# Patient Record
Sex: Male | Born: 1940 | Race: White | Hispanic: No | Marital: Married | Smoking: Former smoker
Health system: Southern US, Community
[De-identification: ages and names within clinical notes are randomized; demographics above are authoritative.]

## PROBLEM LIST (undated history)

## (undated) DIAGNOSIS — K219 Gastro-esophageal reflux disease without esophagitis: Secondary | ICD-10-CM

## (undated) DIAGNOSIS — I2510 Atherosclerotic heart disease of native coronary artery without angina pectoris: Secondary | ICD-10-CM

## (undated) DIAGNOSIS — I714 Abdominal aortic aneurysm, without rupture, unspecified: Secondary | ICD-10-CM

## (undated) DIAGNOSIS — I739 Peripheral vascular disease, unspecified: Secondary | ICD-10-CM

## (undated) DIAGNOSIS — M810 Age-related osteoporosis without current pathological fracture: Secondary | ICD-10-CM

## (undated) DIAGNOSIS — I639 Cerebral infarction, unspecified: Secondary | ICD-10-CM

## (undated) DIAGNOSIS — E785 Hyperlipidemia, unspecified: Secondary | ICD-10-CM

## (undated) DIAGNOSIS — N189 Chronic kidney disease, unspecified: Secondary | ICD-10-CM

## (undated) DIAGNOSIS — C801 Malignant (primary) neoplasm, unspecified: Secondary | ICD-10-CM

## (undated) DIAGNOSIS — F0390 Unspecified dementia without behavioral disturbance: Secondary | ICD-10-CM

## (undated) DIAGNOSIS — I6529 Occlusion and stenosis of unspecified carotid artery: Secondary | ICD-10-CM

## (undated) DIAGNOSIS — Z9889 Other specified postprocedural states: Secondary | ICD-10-CM

## (undated) DIAGNOSIS — I10 Essential (primary) hypertension: Secondary | ICD-10-CM

## (undated) HISTORY — DX: Hyperlipidemia, unspecified: E78.5

## (undated) HISTORY — DX: Occlusion and stenosis of unspecified carotid artery: I65.29

## (undated) HISTORY — DX: Abdominal aortic aneurysm, without rupture, unspecified: I71.40

## (undated) HISTORY — DX: Abdominal aortic aneurysm, without rupture: I71.4

## (undated) HISTORY — PX: PROSTATE SURGERY: SHX751

## (undated) HISTORY — DX: Gastro-esophageal reflux disease without esophagitis: K21.9

## (undated) HISTORY — DX: Peripheral vascular disease, unspecified: I73.9

## (undated) HISTORY — PX: MASTOIDECTOMY: SHX711

## (undated) HISTORY — DX: Other specified postprocedural states: Z98.890

---

## 2007-10-29 HISTORY — PX: CORONARY ARTERY BYPASS GRAFT: SHX141

## 2008-06-23 ENCOUNTER — Ambulatory Visit: Payer: Self-pay | Admitting: Thoracic Surgery (Cardiothoracic Vascular Surgery)

## 2008-08-10 ENCOUNTER — Ambulatory Visit: Payer: Self-pay | Admitting: Cardiothoracic Surgery

## 2009-03-17 ENCOUNTER — Ambulatory Visit: Payer: Self-pay | Admitting: Vascular Surgery

## 2010-03-09 ENCOUNTER — Ambulatory Visit: Payer: Self-pay | Admitting: Vascular Surgery

## 2011-03-12 ENCOUNTER — Encounter (INDEPENDENT_AMBULATORY_CARE_PROVIDER_SITE_OTHER): Payer: Medicare Other

## 2011-03-12 DIAGNOSIS — I714 Abdominal aortic aneurysm, without rupture: Secondary | ICD-10-CM

## 2011-03-12 NOTE — Assessment & Plan Note (Signed)
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  He denies any new medical  difficulties.  He has not had any  GI bleeding.  He denies any symptoms  referable to his aneurysm.  He remains stable from a cardiac standpoint,  having undergone coronary artery bypass grafting in the past.  He does  have elevated cholesterol.   Family history is positive for premature atherosclerotic disease in his  mother.   SOCIAL HISTORY:  He is married with 3 children.  He is retired.  He does  not smoke, having quit in August 2009.  He drinks 1 beer a day.   REVIEW OF SYSTEMS:  No weight loss or weight gain.  He weighs 137  pounds.  He is 5 feet 4 inches tall.  CARDIAC:  Negative.  PULMONARY:  Negative.  GI:  Positive for reflux.  GU:  Positive for urinary frequency.  VASCULAR:  Negative.  NEUROLOGIC:  He does have occasional dizziness.  MUSCULOSKELETAL:  Negative.  PSYCHIATRIC:  Negative.  HEENT:  He has had a diminished change in his hearing.  HEMATOLOGIC:  Negative.  SKIN:  Negative.   PHYSICAL EXAMINATION:  A well-developed, thin white male appearing his  stated age.  Blood pressure is 152/81, pulse 52, respirations 18.  He is  in no acute stress.  HEENT is normal.  He has a harsh left carotid  bruit.  No bruit on the right.  Chest is clear bilaterally.  Heart:  Regular rate and rhythm.  He has 2+ radial, 2+ femoral, and 2+ posterior  tibial pulses.  Abdomen is soft.  He does have a prominent aortic  pulsation.  He has no masses and no tenderness.  Musculoskeletal shows  no major deformity or cyanosis.  Neurologic:  No focal within or  paresthesias.  Skin without ulcers or rashes.   He did undergo repeat  ultrasound of his aorta today, and this shows a  slight increase in size from a maximum of 4 cm up to 4.3 cm from 1 year  ago.  Due to his harsh right carotid bruit, he underwent carotid duplex  in our office today.  This shows mild to moderate carotid stenosis in  the right internal carotid.  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  I explained this to the patient with his family  present.  There is no issue regarding his external carotid.  He  understands this, and we will continue his usual activities.  We plan to  see him again in 1 year with repeat ultrasound of his aorta to rule out  any progression in size.  I did explain symptoms of carotid disease with  him.  He knows to report immediately should this occur.     Larina Earthly, M.D.  Electronically Signed   TFE/MEDQ  D:  03/09/2010  T:  03/12/2010  Job:  [id number]   cc:   Dr. Karis Juba, [facility]

## 2011-03-12 NOTE — Consult Note (Signed)
NEW PATIENT CONSULTATION   Juan Montoya, Juan Montoya  DOB:  April 25, 1941                                       03/17/2009  CHART#:[id number]   The patient presents today for a second opinion regarding peripheral  vascular occlusive disease.Marland Kitchen  His daughter is here with him today.  She  works for Dr. Cephas Darby and I have been asked to see him for  evaluation regarding his peripheral vascular issues.  He is a very  pleasant 70 year old gentleman who had been in reasonable health.  He  underwent  coronary bypass grafting in September 2009.  In October, he  had  a GI bleed with studies showing evidence of left colon ischemic  colitis.  He had another bleed that did not require hospitalization in  November and then in March 2010 had another GI bleed causing repeat  admission, again with a diagnosis of ischemic colitis.  He reports that  he had a 10 pound weight loss at the time of his coronary bypass  grafting but his weight is been stable since that time.  He denies any  pain associated with any of these issues with bleeding.  He specifically  denies any food fear.  He has had an extensive appropriate workup by Dr.  Lollie Sails in [facility].  I have these records and have reviewed his  multiple office visits and evaluations.  Most specifically he has had  mesenteric noninvasive studies which was a  good quality study which  showed no evidence of significant occlusive disease with a patent  celiac, superior mesenteric artery and inferior mesenteric artery.  He  has a known infrarenal abdominal aortic aneurysm which is 3.5 to 4 cm in  maximal diameter.  He has no evidence of peripheral occlusive disease.  He also has known moderate carotid disease on the right from an  ultrasound duplex in October 2009.  He denies any prior stroke.  He has  been stable from a cardiac standpoint since his coronary bypass.  Also  of note he does have baseline renal insufficiency with a  creatinine of  2.5 to 3 range.  The other medical difficulty includes COPD,  hyperlipidemia, diabetes.  He is not insulin-dependent.  His medication  list is attached.   PHYSICAL EXAMINATION:  He is a thin white male in no acute distress.  He  is grossly intact neurologically.  He does require hearing aids.  He has  a harsh right carotid bruit.  No bruit on the left.  His radial pulses  are 2+. He has 2+ femoral, 2+ popliteal and 2+ posterior tibial pulses  bilaterally.  He has prominent pulses but no evidence of peripheral  aneurysm.  His abdominal exam, he is thin.  He has an easily palpable  aneurysm which is nontender.   I discussed his vascular studies from Dr. Mayford Knife' office with the  patient and his daughter.  I completely agree with Dr. Mayford Knife  assessment.  I do not see any evidence of any correctable mesenteric  ischemia as the cause for his GI bleeding.  He does not have any  evidence of intestinal angina and does have noninvasive studies showing  a widely patent mesenteric vessels.  I also agree that he is at a  prohibitive risk for renal worsening with contrast dye and therefore  would not recommend this.  They were reassured with this discussion and  will see [place] again on an as-needed basis.   Larina Earthly, M.D.  Electronically Signed   TFE/MEDQ  D:  03/17/2009  Montoya:  03/20/2009  Job:  [id number]   cc:   Lollie Sails

## 2011-03-12 NOTE — Procedures (Signed)
CAROTID DUPLEX EXAM   INDICATION:  Carotid bruit with known carotid artery disease.   HISTORY:  Diabetes:  No.  Cardiac:  CABG.  Hypertension:  Yes.  Smoking:  Previous.  Previous Surgery:  No carotid artery disease.  CV History:  Asymptomatic.  Amaurosis Fugax No, Paresthesias No, Hemiparesis No                                       RIGHT             LEFT  Brachial systolic pressure:         172               170  Brachial Doppler waveforms:         WNL               WNL  Vertebral direction of flow:        Antegrade         Antegrade  DUPLEX VELOCITIES (cm/sec)  CCA peak systolic                   66                71  ECA peak systolic                   410               113  ICA peak systolic                   135               83  ICA end diastolic                   42                23  PLAQUE MORPHOLOGY:                  Calcific          Calcific  PLAQUE AMOUNT:                      Mild / moderate   Mild  PLAQUE LOCATION:                    ICA/ECA/CCA       ICA/ECA/CCA   IMPRESSION:  1. Right internal carotid artery shows evidence of 40%-59% stenosis.  2. Left internal carotid artery shows evidence of 1%-39% stenosis.  3. No significant changes from previous study done 07/29/2008 at      [facility].   ___________________________________________  Larina Earthly, M.D.   AS/MEDQ  D:  03/09/2010  T:  03/09/2010  Job:  [id number]

## 2011-03-12 NOTE — Procedures (Signed)
DUPLEX ULTRASOUND OF ABDOMINAL AORTA   INDICATION:  Followup known abdominal aortic aneurysm.   HISTORY:  Diabetes:  No.  Cardiac:  CABG times 4.  Hypertension:  Yes.  Smoking:  Quit 1 year ago.  Connective Tissue Disorder:  Family History:  No.  Previous Surgery:  No.   DUPLEX EXAM:         AP (cm)                   TRANSVERSE (cm)  Proximal             3.12 cm                   2.41 cm  Mid                  4.08 cm                   4.35 cm  Distal               3.23 cm                   3.76 cm  Right Iliac          1.13 cm                   1.31 cm  Left Iliac           0.81 cm                   0.82 cm   PREVIOUS:  Date:  AP:  3.5  TRANSVERSE:  4.0   IMPRESSION:  There appears to be an abdominal aortic aneurysm noted with  largest measurement of 4.08 cm x 4.35 cm.   ___________________________________________  Larina Earthly, M.D.   CB/MEDQ  D:  03/09/2010  T:  03/09/2010  Job:  [id number]

## 2011-03-14 NOTE — Procedures (Unsigned)
DUPLEX ULTRASOUND OF ABDOMINAL AORTA  INDICATION:  Known AAA.  HISTORY: Diabetes:  No. Cardiac:  CABG x4. Hypertension:  Yes. Smoking:  Quit, 2010. Connective Tissue Disorder: Family History:  No. Previous Surgery:  No.  DUPLEX EXAM:         AP (cm)                   TRANSVERSE (cm) Proximal             2.88 cm                   2.83 cm Mid                  3.54 cm                   3.57 cm Distal               3.49 cm                   3.63 cm Right Iliac          Not visualized            Not visualized Left Iliac           Not visualized            Not visualized  PREVIOUS:  Date: 03/09/2010  AP:  4.08  TRANSVERSE:  4.35  IMPRESSION: 1. Abdominal aortic aneurysm noted with largest measurement of 3.54 X     3.57 cm. 2. Somewhat limited visualization due to overlying bowel gas.  ___________________________________________ Larina Earthly, M.D.  EM/MEDQ  D:  03/12/2011  T:  03/12/2011  Job:  [id number]

## 2012-01-12 DIAGNOSIS — Z9889 Other specified postprocedural states: Secondary | ICD-10-CM

## 2012-01-12 HISTORY — DX: Other specified postprocedural states: Z98.890

## 2012-03-10 ENCOUNTER — Encounter: Payer: Self-pay | Admitting: Vascular Surgery

## 2012-03-16 ENCOUNTER — Encounter: Payer: Self-pay | Admitting: Neurosurgery

## 2012-03-17 ENCOUNTER — Encounter: Payer: Self-pay | Admitting: Neurosurgery

## 2012-03-17 ENCOUNTER — Ambulatory Visit (INDEPENDENT_AMBULATORY_CARE_PROVIDER_SITE_OTHER): Payer: Medicare Other | Admitting: Neurosurgery

## 2012-03-17 ENCOUNTER — Ambulatory Visit (INDEPENDENT_AMBULATORY_CARE_PROVIDER_SITE_OTHER): Payer: Medicare Other | Admitting: *Deleted

## 2012-03-17 VITALS — BP 157/74 | HR 51 | Resp 16 | Ht 65.5 in | Wt 148.8 lb

## 2012-03-17 DIAGNOSIS — I714 Abdominal aortic aneurysm, without rupture, unspecified: Secondary | ICD-10-CM

## 2012-03-17 NOTE — Progress Notes (Signed)
[facility] HISTORY AND PHYSICAL   CC: AAA duplex for known aneurysm Referring Physician: Early  History of Present Illness: 71 year old male patient of Dr. early is followed for known AAA. Patient nor his wife report any of unusual abdominal or back pain. Patient reports no signs or symptoms of amaurosis fugax. Dr. Arbie Cookey also investigated course carotid bruits with duplex last year but these were not repeated this year. Patient reports no other new medical problems or any recent surgeries.  Past Medical History  Diagnosis Date  . Hyperlipidemia   . GERD (gastroesophageal reflux disease)   . AAA (abdominal aortic aneurysm)   . Peripheral vascular disease   . Carotid artery occlusion   . S/P colonoscopy 01/12/12    ROS: [x]  Positive   [ ]  Denies    General: [ ]  Weight loss, [ ]  Fever, [ ]  chills Neurologic: [ ]  Dizziness, [ ]  Blackouts, [ ]  Seizure [ ]  Stroke, [ ]  "Mini stroke", [ ]  Slurred speech, [ ]  Temporary blindness; [ ]  weakness in arms or legs, [ ]  Hoarseness Cardiac: [ ]  Chest pain/pressure, [ ]  Shortness of breath at rest [ ]  Shortness of breath with exertion, [ ]  Atrial fibrillation or irregular heartbeat Vascular: [ ]  Pain in legs with walking, [ ]  Pain in legs at rest, [ ]  Pain in legs at night,  [ ]  Non-healing ulcer, [ ]  Blood clot in vein/DVT,   Pulmonary: [ ]  Home oxygen, [ ]  Productive cough, [ ]  Coughing up blood, [ ]  Asthma,  [ ]  Wheezing Musculoskeletal:  [ ]  Arthritis, [ ]  Low back pain, [ ]  Joint pain Hematologic: [ ]  Easy Bruising, [ ]  Anemia; [ ]  Hepatitis Gastrointestinal: [ ]  Blood in stool, [ ]  Gastroesophageal Reflux/heartburn, [ ]  Trouble swallowing Urinary: [ ]  chronic Kidney disease, [ ]  on HD - [ ]  MWF or [ ]  TTHS, [ ]  Burning with urination, [ ]  Difficulty urinating Skin: [ ]  Rashes, [ ]  Wounds Psychological: [ ]  Anxiety, [ ]  Depression   Social History History  Substance Use Topics  . Smoking status: Former Smoker      Quit date: 06/27/2008  . Smokeless tobacco: Not on file  . Alcohol Use: Not on file    Family History Family History  Problem Relation Age of Onset  . Coronary artery disease Mother     Allergies  Allergen Reactions  . Ace Inhibitors   . Hctz (Hydrochlorothiazide)     Current Outpatient Prescriptions  Medication Sig Dispense Refill  . amLODipine (NORVASC) 5 MG tablet Take 5 mg by mouth daily.      Marland Kitchen aspirin EC 81 MG tablet Take 81 mg by mouth every other day.      . cloNIDine (CATAPRES - DOSED IN MG/24 HR) 0.1 mg/24hr patch Place 1 patch onto the skin once a week.      . donepezil (ARICEPT) 10 MG tablet Take 10 mg by mouth every morning.       . indomethacin (INDOCIN) 50 MG capsule Take 50 mg by mouth as needed.      . metoprolol succinate (TOPROL-XL) 25 MG 24 hr tablet Take 12.5 mg by mouth 2 (two) times daily.       Marland Kitchen omeprazole (PRILOSEC) 20 MG capsule Take 20 mg by mouth daily.      . pravastatin (PRAVACHOL) 80 MG tablet Take 80 mg by mouth at bedtime.      . sodium bicarbonate 650 MG tablet Take  650 mg by mouth 2 (two) times daily.      . solifenacin (VESICARE) 10 MG tablet Take 5 mg by mouth at bedtime.      . Tamsulosin HCl (FLOMAX) 0.4 MG CAPS Take 0.4 mg by mouth daily.      . ranolazine (RANEXA) 500 MG 12 hr tablet Take 500 mg by mouth 2 (two) times daily.      . simvastatin (ZOCOR) 20 MG tablet Take 20 mg by mouth every evening.        Physical Examination  Filed Vitals:   03/17/12 0934  BP: 157/74  Pulse: 51  Resp: 16    Body mass index is 24.38 kg/(m^2).  General:  WDWN in NAD Gait: Normal HEENT: WNL Eyes: Pupils equal Pulmonary: normal non-labored breathing , without Rales, rhonchi,  wheezing Cardiac: RRR, without  Murmurs, rubs or gallops; No carotid bruits Abdomen: soft, NT, no masses Skin: no rashes, ulcers noted Vascular Exam/Pulses: Patient has 2+ radial pulses bilaterally, carotid bruits are heard bilaterally, there is a palpable abdominal  mass, PT and DP pulses are 1+ bilaterally  Extremities without ischemic changes, no Gangrene , no cellulitis; no open wounds;  Musculoskeletal: no muscle wasting or atrophy  Neurologic: A&O X 3; Appropriate Affect ; SENSATION: normal; MOTOR FUNCTION:  moving all extremities equally. Speech is fluent/normal  Non-Invasive Vascular Imaging: AAA duplex shows a mid AP and transverse 3.7 which is stable from last year  ASSESSMENT/PLAN: Patient with stable AAA that is asymptomatic, the patient will return in one year with AAA duplex as well as carotid duplex and be seen in my clinic. He and his wife's questions were encouraged and answered they're in agreement with this plan.  Lauree Chandler ANP  Clinic M.D.: Early

## 2012-03-18 NOTE — Progress Notes (Signed)
Addended by: Sharee Pimple on: 03/18/2012 10:03 AM   Modules accepted: Orders

## 2012-03-25 NOTE — Procedures (Unsigned)
DUPLEX ULTRASOUND OF ABDOMINAL AORTA  INDICATION:  Follow up AAA.  HISTORY: Diabetes:  No. Cardiac:  Yes. Hypertension:  Yes. Smoking:  Previous. Connective Tissue Disorder: Family History:  No. Previous Surgery:  No.  DUPLEX EXAM:         AP (cm)                   TRANSVERSE (cm) Proximal             2.81 cm                   2.81 cm Mid                  3.7 cm                    3.7 cm Distal               NV Right Iliac          0.78 cm; 270 cm/s         0.9 cm Left Iliac           1.6 cm; 303 cm/s          1.7 cm  PREVIOUS:  Date: 03/12/2011  AP:  3.5  TRANSVERSE:  3.6   IMPRESSION: 1. Stable abdominal aortic aneurysm measuring approximately 3.7 cm x     3.7 cm on today's exam with mild intramural thrombus observed. 2. The left common iliac artery appears ectatic with disease present.     Velocities and waveforms are abnormal. 3. The right common iliac artery appears to be within normal limits.          ___________________________________________ Larina Earthly, M.D.  LT/MEDQ  D:  03/17/2012  T:  03/17/2012  Job:  [id number]

## 2013-03-16 ENCOUNTER — Other Ambulatory Visit (INDEPENDENT_AMBULATORY_CARE_PROVIDER_SITE_OTHER): Payer: 59 | Admitting: *Deleted

## 2013-03-16 ENCOUNTER — Ambulatory Visit: Payer: Medicare Other | Admitting: Neurosurgery

## 2013-03-16 DIAGNOSIS — I714 Abdominal aortic aneurysm, without rupture: Secondary | ICD-10-CM

## 2013-03-16 DIAGNOSIS — I6529 Occlusion and stenosis of unspecified carotid artery: Secondary | ICD-10-CM

## 2013-03-18 ENCOUNTER — Other Ambulatory Visit: Payer: Self-pay | Admitting: *Deleted

## 2013-03-18 ENCOUNTER — Encounter: Payer: Self-pay | Admitting: Vascular Surgery

## 2013-03-18 DIAGNOSIS — I714 Abdominal aortic aneurysm, without rupture: Secondary | ICD-10-CM

## 2013-12-14 ENCOUNTER — Other Ambulatory Visit: Payer: Self-pay | Admitting: Vascular Surgery

## 2013-12-14 DIAGNOSIS — R0989 Other specified symptoms and signs involving the circulatory and respiratory systems: Secondary | ICD-10-CM

## 2014-03-18 ENCOUNTER — Encounter: Payer: Self-pay | Admitting: Vascular Surgery

## 2014-03-22 ENCOUNTER — Ambulatory Visit (HOSPITAL_COMMUNITY)
Admission: RE | Admit: 2014-03-22 | Discharge: 2014-03-22 | Disposition: A | Discharge status: Home / Self Care | Payer: Medicare Other | Source: Ambulatory Visit | Attending: Vascular Surgery | Admitting: Vascular Surgery

## 2014-03-22 ENCOUNTER — Ambulatory Visit (INDEPENDENT_AMBULATORY_CARE_PROVIDER_SITE_OTHER): Payer: Medicare Other | Admitting: Vascular Surgery

## 2014-03-22 ENCOUNTER — Encounter: Payer: Self-pay | Admitting: Vascular Surgery

## 2014-03-22 ENCOUNTER — Ambulatory Visit (INDEPENDENT_AMBULATORY_CARE_PROVIDER_SITE_OTHER)
Admission: RE | Admit: 2014-03-22 | Discharge: 2014-03-22 | Disposition: A | Discharge status: Home / Self Care | Payer: Medicare Other | Source: Ambulatory Visit | Attending: Vascular Surgery | Admitting: Vascular Surgery

## 2014-03-22 VITALS — BP 158/68 | HR 65 | Resp 16 | Ht 66.5 in | Wt 148.7 lb

## 2014-03-22 DIAGNOSIS — R0989 Other specified symptoms and signs involving the circulatory and respiratory systems: Secondary | ICD-10-CM | POA: Insufficient documentation

## 2014-03-22 DIAGNOSIS — I714 Abdominal aortic aneurysm, without rupture, unspecified: Secondary | ICD-10-CM

## 2014-03-22 DIAGNOSIS — I6529 Occlusion and stenosis of unspecified carotid artery: Secondary | ICD-10-CM

## 2014-03-22 NOTE — Progress Notes (Signed)
Today for followup of his infrarenal abdominal aortic aneurysm. He's also been followed for many years with a carotid duplex. This is always showed some right external carotid stenosis but no evidence of internal carotid stenosis. He reports that he has had no new major medical difficulties. He has had no transient ischemic attacks or stroke. He remained stable from a cardiac standpoint. He is active.  Past Medical History  Diagnosis Date  . Hyperlipidemia   . GERD (gastroesophageal reflux disease)   . AAA (abdominal aortic aneurysm)   . Peripheral vascular disease   . Carotid artery occlusion   . S/P colonoscopy 01/12/12    History  Substance Use Topics  . Smoking status: Former Smoker    Quit date: 06/27/2008  . Smokeless tobacco: Never Used  . Alcohol Use: 4.2 oz/week    7 Cans of beer per week    Family History  Problem Relation Age of Onset  . Coronary artery disease Mother     Allergies  Allergen Reactions  . Ace Inhibitors   . Hctz [Hydrochlorothiazide]     Current outpatient prescriptions:amLODipine (NORVASC) 5 MG tablet, Take 5 mg by mouth daily., Disp: , Rfl: ;  aspirin EC 81 MG tablet, Take 81 mg by mouth every other day., Disp: , Rfl: ;  atorvastatin (LIPITOR) 80 MG tablet, Take 80 mg by mouth daily., Disp: , Rfl: ;  cloNIDine (CATAPRES - DOSED IN MG/24 HR) 0.1 mg/24hr patch, Take 0.1 mg by mouth at bedtime. , Disp: , Rfl:  donepezil (ARICEPT) 10 MG tablet, Take 10 mg by mouth every morning. , Disp: , Rfl: ;  indomethacin (INDOCIN) 50 MG capsule, Take 50 mg by mouth as needed., Disp: , Rfl: ;  metoprolol succinate (TOPROL-XL) 25 MG 24 hr tablet, Take 12.5 mg by mouth 2 (two) times daily. , Disp: , Rfl: ;  omeprazole (PRILOSEC) 20 MG capsule, Take 20 mg by mouth daily., Disp: , Rfl: ;  pravastatin (PRAVACHOL) 80 MG tablet, Take 80 mg by mouth at bedtime., Disp: , Rfl:  sodium bicarbonate 650 MG tablet, Take 650 mg by mouth 2 (two) times daily., Disp: , Rfl: ;  solifenacin  (VESICARE) 10 MG tablet, Take 5 mg by mouth at bedtime., Disp: , Rfl: ;  Tamsulosin HCl (FLOMAX) 0.4 MG CAPS, Take 0.4 mg by mouth daily., Disp: , Rfl: ;  ranolazine (RANEXA) 500 MG 12 hr tablet, Take 500 mg by mouth 2 (two) times daily., Disp: , Rfl: ;  simvastatin (ZOCOR) 20 MG tablet, Take 20 mg by mouth every evening., Disp: , Rfl:   BP 158/68  Pulse 65  Resp 16  Ht 5' 6.5" (1.689 m)  Wt 148 lb 11.2 oz (67.45 kg)  BMI 23.64 kg/m2  Body mass index is 23.64 kg/(m^2).       Physical exam well-developed well-nourished in no acute distress Carotid arteries without bruits bilaterally Radial pulses 2+ bilaterally Heart regular rate and rhythm without murmur Radial pulses 2+ bilaterally Abdomen soft nontender prominent aortic pulsation with no tenderness over the aorta  Ultrasound today revealed no evidence of internal carotid artery stenosis. The patient does have known external carotid artery stenosis on the right  Abdominal ultrasound today shows maximal diameter 4 cm which is somewhat less than the study one year ago. He did have some overlying bowel gas  Impression and plan stable aortic aneurysm size with no evidence of progression. We'll repeat in one year. A carotid duplex shows no evidence of significant internal carotid artery  stenosis. Discuss this with the patient and his wife have recommended discontinuation of yearly carotid duplex

## 2014-03-22 NOTE — Addendum Note (Signed)
Addended by: Mena Goes on: 03/22/2014 10:55 AM   Modules accepted: Orders

## 2014-06-25 DIAGNOSIS — N183 Chronic kidney disease, stage 3 unspecified: Secondary | ICD-10-CM | POA: Diagnosis present

## 2014-06-25 DIAGNOSIS — H9190 Unspecified hearing loss, unspecified ear: Secondary | ICD-10-CM | POA: Insufficient documentation

## 2014-06-25 DIAGNOSIS — N529 Male erectile dysfunction, unspecified: Secondary | ICD-10-CM | POA: Insufficient documentation

## 2014-06-25 DIAGNOSIS — K559 Vascular disorder of intestine, unspecified: Secondary | ICD-10-CM | POA: Insufficient documentation

## 2014-06-25 DIAGNOSIS — K5790 Diverticulosis of intestine, part unspecified, without perforation or abscess without bleeding: Secondary | ICD-10-CM | POA: Insufficient documentation

## 2014-06-25 DIAGNOSIS — H90A32 Mixed conductive and sensorineural hearing loss, unilateral, left ear with restricted hearing on the contralateral side: Secondary | ICD-10-CM | POA: Insufficient documentation

## 2014-06-25 DIAGNOSIS — R351 Nocturia: Secondary | ICD-10-CM | POA: Insufficient documentation

## 2014-06-25 DIAGNOSIS — N401 Enlarged prostate with lower urinary tract symptoms: Secondary | ICD-10-CM

## 2014-06-25 DIAGNOSIS — K4090 Unilateral inguinal hernia, without obstruction or gangrene, not specified as recurrent: Secondary | ICD-10-CM | POA: Insufficient documentation

## 2014-06-25 DIAGNOSIS — I6529 Occlusion and stenosis of unspecified carotid artery: Secondary | ICD-10-CM | POA: Insufficient documentation

## 2014-06-25 DIAGNOSIS — I714 Abdominal aortic aneurysm, without rupture, unspecified: Secondary | ICD-10-CM | POA: Insufficient documentation

## 2014-06-25 DIAGNOSIS — E291 Testicular hypofunction: Secondary | ICD-10-CM | POA: Insufficient documentation

## 2014-06-25 DIAGNOSIS — I658 Occlusion and stenosis of other precerebral arteries: Secondary | ICD-10-CM | POA: Insufficient documentation

## 2014-06-25 DIAGNOSIS — J432 Centrilobular emphysema: Secondary | ICD-10-CM | POA: Insufficient documentation

## 2014-06-25 DIAGNOSIS — D126 Benign neoplasm of colon, unspecified: Secondary | ICD-10-CM | POA: Insufficient documentation

## 2014-06-25 DIAGNOSIS — H547 Unspecified visual loss: Secondary | ICD-10-CM | POA: Insufficient documentation

## 2014-06-25 DIAGNOSIS — J449 Chronic obstructive pulmonary disease, unspecified: Secondary | ICD-10-CM | POA: Diagnosis present

## 2014-06-25 DIAGNOSIS — I10 Essential (primary) hypertension: Secondary | ICD-10-CM | POA: Diagnosis present

## 2014-06-25 DIAGNOSIS — K5520 Angiodysplasia of colon without hemorrhage: Secondary | ICD-10-CM | POA: Insufficient documentation

## 2014-06-25 DIAGNOSIS — H95199 Other disorders following mastoidectomy, unspecified ear: Secondary | ICD-10-CM | POA: Insufficient documentation

## 2014-06-25 DIAGNOSIS — R3915 Urgency of urination: Secondary | ICD-10-CM | POA: Insufficient documentation

## 2014-06-25 DIAGNOSIS — N138 Other obstructive and reflux uropathy: Secondary | ICD-10-CM | POA: Diagnosis present

## 2014-11-02 DIAGNOSIS — I679 Cerebrovascular disease, unspecified: Secondary | ICD-10-CM | POA: Insufficient documentation

## 2014-12-27 DIAGNOSIS — H5440 Blindness, one eye, unspecified eye: Secondary | ICD-10-CM | POA: Diagnosis present

## 2014-12-27 DIAGNOSIS — Z0000 Encounter for general adult medical examination without abnormal findings: Secondary | ICD-10-CM | POA: Insufficient documentation

## 2015-03-21 ENCOUNTER — Other Ambulatory Visit: Payer: Self-pay | Admitting: Vascular Surgery

## 2015-03-21 DIAGNOSIS — I714 Abdominal aortic aneurysm, without rupture, unspecified: Secondary | ICD-10-CM

## 2015-03-24 ENCOUNTER — Encounter: Payer: Self-pay | Admitting: Family

## 2015-03-28 ENCOUNTER — Ambulatory Visit (INDEPENDENT_AMBULATORY_CARE_PROVIDER_SITE_OTHER): Payer: Medicare Other | Admitting: Family

## 2015-03-28 ENCOUNTER — Ambulatory Visit (HOSPITAL_COMMUNITY)
Admission: RE | Admit: 2015-03-28 | Discharge: 2015-03-28 | Disposition: A | Discharge status: Home / Self Care | Payer: Medicare Other | Source: Ambulatory Visit | Attending: Vascular Surgery | Admitting: Vascular Surgery

## 2015-03-28 ENCOUNTER — Encounter: Payer: Self-pay | Admitting: Family

## 2015-03-28 VITALS — BP 136/70 | HR 44 | Temp 96.8°F | Resp 16 | Ht 64.5 in | Wt 153.0 lb

## 2015-03-28 DIAGNOSIS — I714 Abdominal aortic aneurysm, without rupture, unspecified: Secondary | ICD-10-CM

## 2015-03-28 DIAGNOSIS — Z87891 Personal history of nicotine dependence: Secondary | ICD-10-CM

## 2015-03-28 NOTE — Progress Notes (Signed)
[facility] [place]  Established Abdominal Aortic Aneurysm  History of Present Illness  Juan Montoya is a 74 y.o. (1940-12-17) male patient of Dr. Donnetta Hutching who is here today for followup of his infrarenal abdominal aortic aneurysm. He's also been followed for many years with a carotid duplex. This is always showed some right external carotid stenosis but no evidence of internal carotid stenosis; at his last visit with Dr. Donnetta Hutching it was decided to discontinue carotid artery surveillance.  who presents with chief complaint: follow up for AAA.  Previous studies demonstrate an AAA, measuring 4.6 cm in 2014.  The patient does not have back or abdominal pain.  The patient is a former smoker. The patient denies claudication in legs with walking, denies non healing wounds. The patient denies history of stroke or TIA symptoms. He takes 81 mg ASA  M-W-F, he bruises easily.  He walks a mile, 5 days/week.  Pt Diabetic: No Pt smoker: former smoker, quit in 2009  Past Medical History  Diagnosis Date  . Hyperlipidemia   . GERD (gastroesophageal reflux disease)   . AAA (abdominal aortic aneurysm)   . Peripheral vascular disease   . Carotid artery occlusion   . S/P colonoscopy 01/12/12   Past Surgical History  Procedure Laterality Date  . Coronary artery bypass graft  2009    4 Vessel   Social History History   Social History  . Marital Status: Married    Spouse Name: N/A  . Number of Children: N/A  . Years of Education: N/A   Occupational History  . Not on file.   Social History Main Topics  . Smoking status: Former Smoker    Quit date: 06/27/2008  . Smokeless tobacco: Never Used  . Alcohol Use: 4.2 oz/week    7 Cans of beer per week  . Drug Use: No  . Sexual Activity: Not on file   Other Topics Concern  . Not on file   Social History Narrative  . No narrative on file   Family History Family History  Problem Relation Age of Onset  . Coronary artery  disease Mother     Current Outpatient Prescriptions on File Prior to Visit  Medication Sig Dispense Refill  . amLODipine (NORVASC) 5 MG tablet Take 5 mg by mouth daily.    Marland Kitchen aspirin EC 81 MG tablet Take 81 mg by mouth every other day.    Marland Kitchen atorvastatin (LIPITOR) 80 MG tablet Take 80 mg by mouth daily.    . cloNIDine (CATAPRES - DOSED IN MG/24 HR) 0.1 mg/24hr patch Take 0.1 mg by mouth at bedtime.     . donepezil (ARICEPT) 10 MG tablet Take 10 mg by mouth every morning.     . indomethacin (INDOCIN) 50 MG capsule Take 50 mg by mouth as needed.    . metoprolol succinate (TOPROL-XL) 25 MG 24 hr tablet Take 12.5 mg by mouth 2 (two) times daily.     Marland Kitchen omeprazole (PRILOSEC) 20 MG capsule Take 20 mg by mouth daily.    . pravastatin (PRAVACHOL) 80 MG tablet Take 80 mg by mouth at bedtime.    . ranolazine (RANEXA) 500 MG 12 hr tablet Take 500 mg by mouth 2 (two) times daily.    . simvastatin (ZOCOR) 20 MG tablet Take 20 mg by mouth every evening.    . sodium bicarbonate 650 MG tablet Take 650 mg by mouth 2 (two) times daily.    . solifenacin (VESICARE) 10 MG tablet  Take 5 mg by mouth at bedtime.    . Tamsulosin HCl (FLOMAX) 0.4 MG CAPS Take 0.4 mg by mouth daily.     No current facility-administered medications on file prior to visit.   Allergies  Allergen Reactions  . Ace Inhibitors   . Hctz [Hydrochlorothiazide]     ROS: See HPI for pertinent positives and negatives.  Physical Examination  Filed Vitals:   03/28/15 0925  BP: 136/70  Pulse: 44  Temp: 96.8 F (36 C)  TempSrc: Oral  Resp: 16  Height: 5' 4.5" (1.638 m)  Weight: 153 lb (69.4 kg)  SpO2: 98%   Body mass index is 25.87 kg/(m^2).  General: A&O x 3, WD.  Pulmonary: Sym exp, good air movt, CTAB, no rales, rhonchi, or wheezing.  Cardiac: RRR, Nl S1, S2, no detected murmur.   Carotid Bruits Right Left   Negative Positive   Aorta is not palpable Radial pulses are 2+ palpable and =                          VASCULAR  EXAM:                                                                                                         LE Pulses Right Left       FEMORAL   palpable   palpable        POPLITEAL  not palpable   not palpable       POSTERIOR TIBIAL   palpable    palpable        DORSALIS PEDIS      ANTERIOR TIBIAL palpable   palpable      Gastrointestinal: soft, NTND, -G/R, - HSM, - masses palpated, - CVAT B.  Musculoskeletal: M/S 5/5 throughout, Extremities without ischemic changes.  Neurologic: CN 2-12 intact, Pain and light touch intact in extremities are intact, Motor exam as listed above.   Non-Invasive Vascular Imaging  AAA Duplex (03/28/2015) ABDOMINAL AORTA DUPLEX EVALUATION    INDICATION: Follow-up abdominal aortic aneurysm     PREVIOUS INTERVENTION(S):     DUPLEX EXAM:     LOCATION DIAMETER AP (cm) DIAMETER TRANSVERSE (cm) VELOCITIES (cm/sec)  Aorta Proximal 2.73 2.77 55  Aorta Mid 3.37 2.72 115  Aorta Distal 4.17 4.1 53  Right Common Iliac Artery 0.97 1.0 401  Left Common Iliac Artery 2.05 1.76 200    Previous max aortic diameter:  3.8cm x 3.95cm Date: 03/22/2014  ADDITIONAL FINDINGS:     IMPRESSION: 1. Tandem aneurysm of the abdominal aorta. The proximal aneurysm is saccular and measures approximately 3.37cm x 2.72cm in an oblique angle. The distal aneurysm is fusiform and measures 4.04cm x 4.1cm with mild intramural thrombus. 2. 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 3. The left common iliac artery is ectatic with significant (>50%) stenosis observed.      Compared to the previous exam:  Abdominal aortic aneurysm measurement is slightly larger than previous exam but is similar to reports prior to last  year.      Medical Decision Making  The patient is a 74 y.o. male who presents with asymptomatic AAA with no increase in size.   Based on this patient's exam and diagnostic studies, the patient will follow up in 1 year  with the  following studies: AAA Duplex.  Consideration for repair of AAA would be made when the size is 5.0 cm, growth > 1 cm/yr, and symptomatic status.  I emphasized the importance of maximal medical management including strict control of blood pressure, blood glucose, and lipid levels, antiplatelet agents, obtaining regular exercise, and continued cessation of smoking.   The patient was given information about AAA including signs, symptoms, treatment, and how to minimize the risk of enlargement and rupture of aneurysms.    The patient was advised to call 911 should the patient experience sudden onset abdominal or back pain.   Thank you for allowing [place] to participate in this patient's care.  Clemon Chambers, RN, MSN, FNP-C [facility] [place] Office: [phone]  Clinic Physician: Early  03/28/2015, 9:25 AM

## 2015-03-28 NOTE — Addendum Note (Signed)
Addended by: MCCHESNEY, MARILYN K on: 03/28/2015 02:08 PM   Modules accepted: Orders  

## 2015-03-28 NOTE — Patient Instructions (Signed)
Abdominal Aortic Aneurysm An aneurysm is a weakened or damaged part of an artery wall that bulges from the normal force of blood pumping through the body. An abdominal aortic aneurysm is an aneurysm that occurs in the lower part of the aorta, the main artery of the body.  The major concern with an abdominal aortic aneurysm is that it can enlarge and burst (rupture) or blood can flow between the layers of the wall of the aorta through a tear (aorticdissection). Both of these conditions can cause bleeding inside the body and can be life threatening unless diagnosed and treated promptly. CAUSES  The exact cause of an abdominal aortic aneurysm is unknown. Some contributing factors are:   A hardening of the arteries caused by the buildup of fat and other substances in the lining of a blood vessel (arteriosclerosis).  Inflammation of the walls of an artery (arteritis).   Connective tissue diseases, such as Marfan syndrome.   Abdominal trauma.   An infection, such as syphilis or staphylococcus, in the wall of the aorta (infectious aortitis) caused by bacteria. RISK FACTORS  Risk factors that contribute to an abdominal aortic aneurysm may include:  Age older than 60 years.   High blood pressure (hypertension).  Male gender.  Ethnicity (white race).  Obesity.  Family history of aneurysm (first degree relatives only).  Tobacco use. PREVENTION  The following healthy lifestyle habits may help decrease your risk of abdominal aortic aneurysm:  Quitting smoking. Smoking can raise your blood pressure and cause arteriosclerosis.  Limiting or avoiding alcohol.  Keeping your blood pressure, blood sugar level, and cholesterol levels within normal limits.  Decreasing your salt intake. In somepeople, too much salt can raise blood pressure and increase your risk of abdominal aortic aneurysm.  Eating a diet low in saturated fats and cholesterol.  Increasing your fiber intake by including  whole grains, vegetables, and fruits in your diet. Eating these foods may help lower blood pressure.  Maintaining a healthy weight.  Staying physically active and exercising regularly. SYMPTOMS  The symptoms of abdominal aortic aneurysm may vary depending on the size and rate of growth of the aneurysm.Most grow slowly and do not have any symptoms. When symptoms do occur, they may include:  Pain (abdomen, side, lower back, or groin). The pain may vary in intensity. A sudden onset of severe pain may indicate that the aneurysm has ruptured.  Feeling full after eating only small amounts of food.  Nausea or vomiting or both.  Feeling a pulsating lump in the abdomen.  Feeling faint or passing out. DIAGNOSIS  Since most unruptured abdominal aortic aneurysms have no symptoms, they are often discovered during diagnostic exams for other conditions. An aneurysm may be found during the following procedures:  Ultrasonography (A one-time screening for abdominal aortic aneurysm by ultrasonography is also recommended for all men aged 65-75 years who have ever smoked).  X-ray exams.  A computed tomography (CT).  Magnetic resonance imaging (MRI).  Angiography or arteriography. TREATMENT  Treatment of an abdominal aortic aneurysm depends on the size of your aneurysm, your age, and risk factors for rupture. Medication to control blood pressure and pain may be used to manage aneurysms smaller than 6 cm. Regular monitoring for enlargement may be recommended by your caregiver if:  The aneurysm is 3-4 cm in size (an annual ultrasonography may be recommended).  The aneurysm is 4-4.5 cm in size (an ultrasonography every 6 months may be recommended).  The aneurysm is larger than 4.5 cm in   size (your caregiver may ask that you be examined by a vascular surgeon). If your aneurysm is larger than 6 cm, surgical repair may be recommended. There are two main methods for repair of an aneurysm:   Endovascular  repair (a minimally invasive surgery). This is done most often.  Open repair. This method is used if an endovascular repair is not possible. Document Released: 07/24/2005 Document Revised: 02/08/2013 Document Reviewed: 11/13/2012 Exit[employer] Patient Information 2015 [employer]. This information is not intended to replace advice given to you by your health care provider. Make sure you discuss any questions you have with your health care provider.  

## 2015-04-03 DIAGNOSIS — Z951 Presence of aortocoronary bypass graft: Secondary | ICD-10-CM | POA: Insufficient documentation

## 2015-04-03 DIAGNOSIS — R002 Palpitations: Secondary | ICD-10-CM | POA: Insufficient documentation

## 2015-04-25 DIAGNOSIS — G451 Carotid artery syndrome (hemispheric): Secondary | ICD-10-CM | POA: Insufficient documentation

## 2015-04-26 DIAGNOSIS — G309 Alzheimer's disease, unspecified: Secondary | ICD-10-CM

## 2015-04-26 DIAGNOSIS — I63312 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 artery: Secondary | ICD-10-CM | POA: Diagnosis present

## 2015-04-26 DIAGNOSIS — F0280 Dementia in other diseases classified elsewhere without behavioral disturbance: Secondary | ICD-10-CM | POA: Diagnosis present

## 2015-05-29 HISTORY — PX: CARDIAC CATHETERIZATION: SHX172

## 2015-06-13 DIAGNOSIS — I25709 Atherosclerosis of coronary artery bypass graft(s), unspecified, with unspecified angina pectoris: Secondary | ICD-10-CM | POA: Diagnosis present

## 2016-01-12 DIAGNOSIS — M79605 Pain in left leg: Secondary | ICD-10-CM | POA: Insufficient documentation

## 2016-02-01 DIAGNOSIS — E782 Mixed hyperlipidemia: Secondary | ICD-10-CM | POA: Insufficient documentation

## 2016-03-26 ENCOUNTER — Encounter: Payer: Self-pay | Admitting: Family

## 2016-03-29 ENCOUNTER — Ambulatory Visit (HOSPITAL_COMMUNITY)
Admission: RE | Admit: 2016-03-29 | Discharge: 2016-03-29 | Disposition: A | Discharge status: Home / Self Care | Payer: Medicare Other | Source: Ambulatory Visit | Attending: Family | Admitting: Family

## 2016-03-29 ENCOUNTER — Encounter: Payer: Self-pay | Admitting: Family

## 2016-03-29 ENCOUNTER — Ambulatory Visit (INDEPENDENT_AMBULATORY_CARE_PROVIDER_SITE_OTHER): Payer: Medicare Other | Admitting: Family

## 2016-03-29 VITALS — BP 152/77 | HR 56 | Temp 97.3°F | Resp 18 | Ht 65.5 in | Wt 150.0 lb

## 2016-03-29 DIAGNOSIS — Z87891 Personal history of nicotine dependence: Secondary | ICD-10-CM | POA: Diagnosis not present

## 2016-03-29 DIAGNOSIS — I708 Atherosclerosis of other arteries: Secondary | ICD-10-CM | POA: Insufficient documentation

## 2016-03-29 DIAGNOSIS — E785 Hyperlipidemia, unspecified: Secondary | ICD-10-CM | POA: Diagnosis not present

## 2016-03-29 DIAGNOSIS — I714 Abdominal aortic aneurysm, without rupture, unspecified: Secondary | ICD-10-CM

## 2016-03-29 DIAGNOSIS — I6523 Occlusion and stenosis of bilateral carotid arteries: Secondary | ICD-10-CM

## 2016-03-29 DIAGNOSIS — K219 Gastro-esophageal reflux disease without esophagitis: Secondary | ICD-10-CM | POA: Diagnosis not present

## 2016-03-29 NOTE — Progress Notes (Signed)
[facility] [place]   CC: Follow up Abdominal Aortic Aneurysm  History of Present Illness  Juan Montoya is a 75 y.o. (April 17, 1941) male  patient of Dr. Donnetta Hutching who is here today for followup of his infrarenal abdominal aortic aneurysm. He's also been followed for many years with a carotid duplex. This is always showed some right external carotid stenosis but no evidence of internal carotid stenosis; at his last visit with Dr. Donnetta Hutching it was decided to discontinue carotid artery surveillance. Last carotid duplex was in May of 2015.    Previous studies demonstrate an AAA, measuring 4.6 cm in 2014. The patient does not have back or abdominal pain. The patient is a former smoker. The patient denies claudication in legs with walking, denies non healing wounds. The patient denies history of stroke or TIA symptoms. He takes 325 mg ASA daily.  He take 80 mg atorvastatin daily.  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 Cardiac stent placed in August 2016 after chest tightness, his cardiologist is Dr. Renda Rolls in [facility]. He took Brilinta for 6-8 months.  He fell in March 2017 and injured left hamstring by missing a step and landing on this, had physical and occupational therapy.  He is trying to work back into walking a mile/day.  Pt Diabetic: No Pt smoker: former smoker, quit in 2009    Past Medical History  Diagnosis Date  . Hyperlipidemia   . GERD (gastroesophageal reflux disease)   . AAA (abdominal aortic aneurysm) ([place])   . Peripheral vascular disease ([place])   . Carotid artery occlusion   . S/P colonoscopy 01/12/12   Past Surgical History  Procedure Laterality Date  . Coronary artery bypass graft  2009    4 Vessel   Social History Social History   Social History  . Marital Status: Married    Spouse Name: N/A  . Number of Children: N/A  . Years of Education: N/A   Occupational History  .  Not on file.   Social History Main Topics  . Smoking status: Former Smoker    Quit date: 06/27/2008  . Smokeless tobacco: Never Used  . Alcohol Use: 4.2 oz/week    7 Cans of beer per week  . Drug Use: No  . Sexual Activity: Not on file   Other Topics Concern  . Not on file   Social History Narrative   Family History Family History  Problem Relation Age of Onset  . Coronary artery disease Mother     Current Outpatient Prescriptions on File Prior to Visit  Medication Sig Dispense Refill  . amLODipine (NORVASC) 5 MG tablet Take 5 mg by mouth daily.    Marland Kitchen aspirin EC 81 MG tablet Take 81 mg by mouth every other day.    Marland Kitchen atorvastatin (LIPITOR) 80 MG tablet Take 80 mg by mouth daily.    Marland Kitchen B-Complex-C-Biotin-Minerals-FA (FOLBEE PLUS CZ) 5 MG TABS TAKE 1 TABLET BY MOUTH EVERY DAY    . donepezil (ARICEPT) 10 MG tablet Take 10 mg by mouth every morning.     . febuxostat (ULORIC) 40 MG tablet Take 40 mg by mouth.    . memantine (NAMENDA XR) 28 MG CP24 24 hr capsule Take 28 mg by mouth daily.    . metoprolol succinate (TOPROL-XL) 25 MG 24 hr tablet Take 12.5 mg by mouth 2 (two) times daily.     Marland Kitchen omeprazole (PRILOSEC) 20 MG  capsule Take 20 mg by mouth daily.    . Probiotic Product (PROBIOTIC & ACIDOPHILUS EX ST PO) Take 1 tablet by mouth.    . ranolazine (RANEXA) 500 MG 12 hr tablet Take 500 mg by mouth 2 (two) times daily.    . sodium bicarbonate 650 MG tablet Take 650 mg by mouth daily.     . solifenacin (VESICARE) 10 MG tablet Take 5 mg by mouth at bedtime.    . Tamsulosin HCl (FLOMAX) 0.4 MG CAPS Take 0.4 mg by mouth daily.    . cloNIDine (CATAPRES - DOSED IN MG/24 HR) 0.1 mg/24hr patch Take 0.1 mg by mouth at bedtime. Reported on 03/29/2016    . indomethacin (INDOCIN) 50 MG capsule Take 50 mg by mouth as needed. Reported on 03/29/2016    . pravastatin (PRAVACHOL) 80 MG tablet Take 80 mg by mouth at bedtime. Reported on 03/29/2016    . simvastatin (ZOCOR) 20 MG tablet Take 20 mg by mouth  every evening. Reported on 03/29/2016     No current facility-administered medications on file prior to visit.   Allergies  Allergen Reactions  . Codeine Itching    nausea  . Ace Inhibitors Other (See Comments)    hyponatremia  . Hctz [Hydrochlorothiazide] Other (See Comments)    hyponatremia    ROS: See HPI for pertinent positives and negatives.  Physical Examination  Filed Vitals:   03/29/16 0906 03/29/16 0914  BP: 146/78 152/77  Pulse: 56 56  Temp: 97.3 F (36.3 C)   Resp: 18   Height: 5' 5.5" (1.664 m)   Weight: 150 lb (68.04 kg)   SpO2: 93%    Body mass index is 24.57 kg/(m^2).  General: A&O x 3, WD.  Pulmonary: Sym exp, good air movt, CTAB, no rales, rhonchi, or wheezing.  Cardiac: RRR, Nl S1, S2, no detected murmur.   Carotid Bruits Right Left   Negative Positive  Aorta is not palpable Radial pulses are 2+ palpable and =   VASCULAR EXAM:     LE Pulses Right Left   FEMORAL  palpable  palpable    POPLITEAL not palpable  not palpable   POSTERIOR TIBIAL  palpable   palpable    DORSALIS PEDIS  ANTERIOR TIBIAL palpable  palpable      Gastrointestinal: soft, NTND, -G/R, - HSM, - masses palpated, - CVAT B.  Musculoskeletal: M/S 5/5 throughout, Extremities without ischemic changes.  Neurologic: CN 2-12 intact, Pain and light touch intact in extremities are intact, Motor exam as listed above.         Non-Invasive Vascular Imaging  AAA Duplex (03/29/2016) ABDOMINAL AORTA DUPLEX EVALUATION    INDICATION: Follow-up abdominal aortic aneurysm     PREVIOUS INTERVENTION(S):     DUPLEX EXAM:     LOCATION DIAMETER AP (cm) DIAMETER TRANSVERSE (cm) VELOCITIES (cm/sec)  Aorta Proximal 2.8 2.9 105  Aorta Mid 3.4 3.8 131  Aorta Distal 4.1 4.0 44   Right Common Iliac Artery 1.0 1.3 427  Left Common Iliac Artery 2.0 2.1 381    Previous max aortic diameter:  4.1 cm x 4.1 cm Date: 03/28/2015     ADDITIONAL FINDINGS: Oblique angles used to obtain abdominal measurements in the transverse plane.    IMPRESSION: 1. Tandem aneurysm of the abdominal aorta. The proximal aneurysm is saccular and measures approximately 2.8 cm x 2.72cm in an oblique angle. The distal aneurysm is fusiform and measures 4.1 cm x 4.0 cm with mild intramural thrombus. 2. Significant (>50%) stenosis  is observed in the right common iliac artery. 3. 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      Compared to the previous exam:  No significant changes noted compared to previous exam of 03/28/2015.     Medical Decision Making  The patient is a 75 y.o. male who presents with asymptomatic AAA with no increase in size. He had a small brainstem CVA in June of 2016; last carotd duplex on file was May 2015 with minimal bilateral ICA stenoses; will repeat carotid duplex on his return in a year.   Based on this patient's exam and diagnostic studies, the patient will follow up in 1 year  with the following studies: AAA duplex and carotid duplex.  Consideration for repair of AAA would be made when the size is 5.5 cm, growth > 1 cm/yr, and symptomatic status.  I emphasized the importance of maximal medical management including strict control of blood pressure, blood glucose, and lipid levels, antiplatelet agents, obtaining regular exercise, and continued cessation of smoking.   The patient was given information about AAA including signs, symptoms, treatment, and how to minimize the risk of enlargement and rupture of aneurysms.    The patient was advised to call 911 should the patient experience sudden onset abdominal or back pain.   Thank you for allowing [place] to participate in this patient's care.  Clemon Chambers, RN, MSN,  FNP-C [facility] [place] Office: [phone]  Clinic Physician: Bridgett Larsson  03/29/2016, 9:18 AM

## 2016-03-29 NOTE — Progress Notes (Signed)
Filed Vitals:   03/29/16 0906 03/29/16 0914  BP: 146/78 152/77  Pulse: 56 56  Temp: 97.3 F (36.3 C)   Resp: 18   Height: 5' 5.5" (1.664 m)   Weight: 150 lb (68.04 kg)   SpO2: 93%

## 2016-03-29 NOTE — Patient Instructions (Signed)
Abdominal Aortic Aneurysm An aneurysm is a weakened or damaged part of an artery wall that bulges from the normal force of blood pumping through the body. An abdominal aortic aneurysm is an aneurysm that occurs in the lower part of the aorta, the main artery of the body.  The major concern with an abdominal aortic aneurysm is that it can enlarge and burst (rupture) or blood can flow between the layers of the wall of the aorta through a tear (aorticdissection). Both of these conditions can cause bleeding inside the body and can be life threatening unless diagnosed and treated promptly. CAUSES  The exact cause of an abdominal aortic aneurysm is unknown. Some contributing factors are:   A hardening of the arteries caused by the buildup of fat and other substances in the lining of a blood vessel (arteriosclerosis).  Inflammation of the walls of an artery (arteritis).   Connective tissue diseases, such as Marfan syndrome.   Abdominal trauma.   An infection, such as syphilis or staphylococcus, in the wall of the aorta (infectious aortitis) caused by bacteria. RISK FACTORS  Risk factors that contribute to an abdominal aortic aneurysm may include:  Age older than 60 years.   High blood pressure (hypertension).  Male gender.  Ethnicity (white race).  Obesity.  Family history of aneurysm (first degree relatives only).  Tobacco use. PREVENTION  The following healthy lifestyle habits may help decrease your risk of abdominal aortic aneurysm:  Quitting smoking. Smoking can raise your blood pressure and cause arteriosclerosis.  Limiting or avoiding alcohol.  Keeping your blood pressure, blood sugar level, and cholesterol levels within normal limits.  Decreasing your salt intake. In somepeople, too much salt can raise blood pressure and increase your risk of abdominal aortic aneurysm.  Eating a diet low in saturated fats and cholesterol.  Increasing your fiber intake by including  whole grains, vegetables, and fruits in your diet. Eating these foods may help lower blood pressure.  Maintaining a healthy weight.  Staying physically active and exercising regularly. SYMPTOMS  The symptoms of abdominal aortic aneurysm may vary depending on the size and rate of growth of the aneurysm.Most grow slowly and do not have any symptoms. When symptoms do occur, they may include:  Pain (abdomen, side, lower back, or groin). The pain may vary in intensity. A sudden onset of severe pain may indicate that the aneurysm has ruptured.  Feeling full after eating only small amounts of food.  Nausea or vomiting or both.  Feeling a pulsating lump in the abdomen.  Feeling faint or passing out. DIAGNOSIS  Since most unruptured abdominal aortic aneurysms have no symptoms, they are often discovered during diagnostic exams for other conditions. An aneurysm may be found during the following procedures:  Ultrasonography (A one-time screening for abdominal aortic aneurysm by ultrasonography is also recommended for all men aged 65-75 years who have ever smoked).  X-ray exams.  A computed tomography (CT).  Magnetic resonance imaging (MRI).  Angiography or arteriography. TREATMENT  Treatment of an abdominal aortic aneurysm depends on the size of your aneurysm, your age, and risk factors for rupture. Medication to control blood pressure and pain may be used to manage aneurysms smaller than 6 cm. Regular monitoring for enlargement may be recommended by your caregiver if:  The aneurysm is 3-4 cm in size (an annual ultrasonography may be recommended).  The aneurysm is 4-4.5 cm in size (an ultrasonography every 6 months may be recommended).  The aneurysm is larger than 4.5 cm in   size (your caregiver may ask that you be examined by a vascular surgeon). If your aneurysm is larger than 6 cm, surgical repair may be recommended. There are two main methods for repair of an aneurysm:   Endovascular  repair (a minimally invasive surgery). This is done most often.  Open repair. This method is used if an endovascular repair is not possible.   This information is not intended to replace advice given to you by your health care provider. Make sure you discuss any questions you have with your health care provider.   Document Released: 07/24/2005 Document Revised: 02/08/2013 Document Reviewed: 11/13/2012 Elsevier Interactive Patient Education 2016 [employer]  

## 2016-08-07 DIAGNOSIS — M810 Age-related osteoporosis without current pathological fracture: Secondary | ICD-10-CM | POA: Diagnosis present

## 2016-08-27 DIAGNOSIS — K625 Hemorrhage of anus and rectum: Secondary | ICD-10-CM | POA: Insufficient documentation

## 2016-08-27 DIAGNOSIS — E7800 Pure hypercholesterolemia, unspecified: Secondary | ICD-10-CM | POA: Diagnosis present

## 2017-01-07 DIAGNOSIS — R42 Dizziness and giddiness: Secondary | ICD-10-CM | POA: Insufficient documentation

## 2017-03-28 NOTE — Addendum Note (Signed)
Addended by: Lianne Cure A on: 03/28/2017 01:38 PM   Modules accepted: Orders

## 2017-03-31 ENCOUNTER — Encounter: Payer: Self-pay | Admitting: Family

## 2017-04-03 ENCOUNTER — Ambulatory Visit (INDEPENDENT_AMBULATORY_CARE_PROVIDER_SITE_OTHER): Payer: Medicare Other | Admitting: Family

## 2017-04-03 ENCOUNTER — Ambulatory Visit (HOSPITAL_COMMUNITY)
Admission: RE | Admit: 2017-04-03 | Discharge: 2017-04-03 | Disposition: A | Discharge status: Home / Self Care | Payer: Medicare Other | Source: Ambulatory Visit | Attending: Family | Admitting: Family

## 2017-04-03 ENCOUNTER — Encounter: Payer: Self-pay | Admitting: Family

## 2017-04-03 ENCOUNTER — Ambulatory Visit (INDEPENDENT_AMBULATORY_CARE_PROVIDER_SITE_OTHER)
Admission: RE | Admit: 2017-04-03 | Discharge: 2017-04-03 | Disposition: A | Discharge status: Home / Self Care | Payer: Medicare Other | Source: Ambulatory Visit | Attending: Family | Admitting: Family

## 2017-04-03 VITALS — BP 160/88 | HR 50 | Resp 18 | Ht 65.5 in | Wt 153.0 lb

## 2017-04-03 DIAGNOSIS — I714 Abdominal aortic aneurysm, without rupture, unspecified: Secondary | ICD-10-CM

## 2017-04-03 DIAGNOSIS — Z87891 Personal history of nicotine dependence: Secondary | ICD-10-CM | POA: Diagnosis not present

## 2017-04-03 DIAGNOSIS — I7409 Other arterial embolism and thrombosis of abdominal aorta: Secondary | ICD-10-CM | POA: Diagnosis not present

## 2017-04-03 DIAGNOSIS — I6523 Occlusion and stenosis of bilateral carotid arteries: Secondary | ICD-10-CM

## 2017-04-03 LAB — VAS US CAROTID
LCCADSYS: 83 cm/s
LCCAPSYS: 57 cm/s
LEFT ECA DIAS: 10 cm/s
LEFT VERTEBRAL DIAS: -7 cm/s
Left CCA dist dias: 16 cm/s
Left CCA prox dias: 9 cm/s
Left ICA dist dias: -18 cm/s
Left ICA dist sys: -77 cm/s
Left ICA prox dias: 17 cm/s
Left ICA prox sys: 107 cm/s
RCCAPSYS: 58 cm/s
RIGHT CCA MID DIAS: 7 cm/s
RIGHT VERTEBRAL DIAS: 12 cm/s
Right CCA prox dias: 9 cm/s
Right cca dist sys: -54 cm/s

## 2017-04-03 NOTE — Progress Notes (Signed)
[facility] [place] HISTORY AND PHYSICAL   MRN : [id number]  History of Present Illness:   Juan Montoya is a 76 y.o. male patient of Dr. Donnetta Hutching who is here today for followup of his infrarenal abdominal aortic aneurysm. He's also been followed for many years with a carotid duplex. This is always showed some right external carotid stenosis but no evidence of internal carotid stenosis; at his last visit with Dr. Donnetta Hutching it was decided to discontinue carotid artery surveillance.    Previous studies demonstrate an AAA, measuring 4.6 cm in 2014. The patient does not have back or abdominal pain. The patient is a former smoker. The patient denies claudication in legs with walking, denies non healing wounds.  He takes 325 mg ASA daily.  He take 80 mg atorvastatin daily.  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 Cardiac stent placed in August 2016 after chest tightness, his cardiologist is Dr. Renda Rolls in [facility] He took Brilinta for 6-8 months.  He fell in March 2017 and injured left hamstring by missing a step and landing on this, had physical and occupational therapy.  He is trying to work back into walking a mile/day.  Wife states pt's blood pressure at home runs [phone] systolic.   Pt Diabetic: No Pt smoker: former smoker, quit in 2009     Current Outpatient Prescriptions  Medication Sig Dispense Refill  . amLODipine (NORVASC) 5 MG tablet Take 5 mg by mouth daily.    Marland Kitchen aspirin EC 81 MG tablet Take 81 mg by mouth every other day.    Marland Kitchen atorvastatin (LIPITOR) 80 MG tablet Take 80 mg by mouth daily.    Marland Kitchen B-Complex-C-Biotin-Minerals-FA (FOLBEE PLUS CZ) 5 MG TABS TAKE 1 TABLET BY MOUTH EVERY DAY    . cloNIDine (CATAPRES - DOSED IN MG/24 HR) 0.1 mg/24hr patch Take 0.1 mg by mouth at bedtime. Reported on 03/29/2016    . donepezil (ARICEPT) 10 MG tablet Take 10 mg by mouth every morning.     .  febuxostat (ULORIC) 40 MG tablet Take 40 mg by mouth.    . indomethacin (INDOCIN) 50 MG capsule Take 50 mg by mouth as needed. Reported on 03/29/2016    . memantine (NAMENDA XR) 28 MG CP24 24 hr capsule Take 28 mg by mouth daily.    . metoprolol succinate (TOPROL-XL) 25 MG 24 hr tablet Take 12.5 mg by mouth 2 (two) times daily.     Marland Kitchen omeprazole (PRILOSEC) 20 MG capsule Take 20 mg by mouth daily.    . pravastatin (PRAVACHOL) 80 MG tablet Take 80 mg by mouth at bedtime. Reported on 03/29/2016    . Probiotic Product (PROBIOTIC & ACIDOPHILUS EX ST PO) Take 1 tablet by mouth.    . ranolazine (RANEXA) 500 MG 12 hr tablet Take 500 mg by mouth 2 (two) times daily.    . simvastatin (ZOCOR) 20 MG tablet Take 20 mg by mouth every evening. Reported on 03/29/2016    . sodium bicarbonate 650 MG tablet Take 650 mg by mouth daily.     . solifenacin (VESICARE) 10 MG tablet Take 5 mg by mouth at bedtime.    . Tamsulosin HCl (FLOMAX) 0.4 MG CAPS Take 0.4 mg by mouth daily.     No current facility-administered medications for this visit.     Past Medical History:  Diagnosis Date  . AAA (abdominal aortic aneurysm) ([place])   .  Carotid artery occlusion   . GERD (gastroesophageal reflux disease)   . Hyperlipidemia   . Peripheral vascular disease ([place])   . S/P colonoscopy 01/12/12    Social History Social History  Substance Use Topics  . Smoking status: Former Smoker    Quit date: 06/27/2008  . Smokeless tobacco: Never Used  . Alcohol use 4.2 oz/week    7 Cans of beer per week    Family History Family History  Problem Relation Age of Onset  . Coronary artery disease Mother     Surgical History Past Surgical History:  Procedure Laterality Date  . CORONARY ARTERY BYPASS GRAFT  2009   4 Vessel    Allergies  Allergen Reactions  . Codeine Itching    nausea  . Ace Inhibitors Other (See Comments)    hyponatremia  . Hctz [Hydrochlorothiazide] Other (See Comments)    hyponatremia    Current Outpatient  Prescriptions  Medication Sig Dispense Refill  . amLODipine (NORVASC) 5 MG tablet Take 5 mg by mouth daily.    Marland Kitchen aspirin EC 81 MG tablet Take 81 mg by mouth every other day.    Marland Kitchen atorvastatin (LIPITOR) 80 MG tablet Take 80 mg by mouth daily.    Marland Kitchen B-Complex-C-Biotin-Minerals-FA (FOLBEE PLUS CZ) 5 MG TABS TAKE 1 TABLET BY MOUTH EVERY DAY    . cloNIDine (CATAPRES - DOSED IN MG/24 HR) 0.1 mg/24hr patch Take 0.1 mg by mouth at bedtime. Reported on 03/29/2016    . donepezil (ARICEPT) 10 MG tablet Take 10 mg by mouth every morning.     . febuxostat (ULORIC) 40 MG tablet Take 40 mg by mouth.    . indomethacin (INDOCIN) 50 MG capsule Take 50 mg by mouth as needed. Reported on 03/29/2016    . memantine (NAMENDA XR) 28 MG CP24 24 hr capsule Take 28 mg by mouth daily.    . metoprolol succinate (TOPROL-XL) 25 MG 24 hr tablet Take 12.5 mg by mouth 2 (two) times daily.     Marland Kitchen omeprazole (PRILOSEC) 20 MG capsule Take 20 mg by mouth daily.    . pravastatin (PRAVACHOL) 80 MG tablet Take 80 mg by mouth at bedtime. Reported on 03/29/2016    . Probiotic Product (PROBIOTIC & ACIDOPHILUS EX ST PO) Take 1 tablet by mouth.    . ranolazine (RANEXA) 500 MG 12 hr tablet Take 500 mg by mouth 2 (two) times daily.    . simvastatin (ZOCOR) 20 MG tablet Take 20 mg by mouth every evening. Reported on 03/29/2016    . sodium bicarbonate 650 MG tablet Take 650 mg by mouth daily.     . solifenacin (VESICARE) 10 MG tablet Take 5 mg by mouth at bedtime.    . Tamsulosin HCl (FLOMAX) 0.4 MG CAPS Take 0.4 mg by mouth daily.     No current facility-administered medications for this visit.      REVIEW OF SYSTEMS: See HPI for pertinent positives and negatives.  Physical Examination Vitals:   04/03/17 0920 04/03/17 0924  BP: (!) 150/84 (!) 160/88  Pulse: (!) 50 (!) 50  Resp: 18   SpO2: 93%   Weight: 153 lb (69.4 kg)   Height: 5' 5.5" (1.664 m)    Body mass index is 25.07 kg/m.   General:  A&O x 3, WDWN.  Pulmonary: Sym exp,  good air movt, CTAB, no rales, rhonchi, or wheezing.  Cardiac: RRR, Nl S1, S2, no detected murmur.   Carotid Bruits Right Left   Negative Positive  Aorta is not palpable Radial pulses are 2+ palpable and =   VASCULAR EXAM:     LE Pulses Right Left   FEMORAL  palpable  palpable    POPLITEAL not palpable  not palpable   POSTERIOR TIBIAL  palpable   palpable    DORSALIS PEDIS  ANTERIOR TIBIAL palpable  palpable      Gastrointestinal: soft, NTND, -G/R, - HSM, - masses palpated, - CVAT B.  Musculoskeletal: M/S 5/5 throughout, Extremities without ischemic changes.  Neurologic: CN 2-12 intact, Pain and light touch intact in extremities are intact, Motor exam as listed above     ASSESSMENT:  Juan Montoya is a 76 y.o. male who has a history of a small brainstem CVA in 2016 as manifested by expressive aphasia, no residual deficits, no subsequent stroke or TIA. He also has an asymptomatic 4.2 cm AAA.    DATA (Date: 04/03/2017)  Carotid Duplex: <40% bilateral ICA stenosis. Right ECA stenosis. Bilateral vertebral artery flow is antegrade.  Bilateral subclavian artery waveforms are normal.  No significant change compared to the last exam on 03-22-14  AAA Duplex: Technically difficult exam due to pt inability to tolerate pressure of the probe.  Oblique angles used to obtain abdominal measurements in a transverse plane. The largest aneurysm is a fusiform distal abdominal aortic aneurysm with intramural thrombus measuring 4.1 cm x 4.2 cm. >50% right CIA stenosis, the left CIA was not thoroughly visualized.  No significant change compared to the exam on 03-29-16.    PLAN:   Based on today's exam and non-invasive vascular lab results, the patient will follow up in  1 year with the following tests: AAA duplex, carotid duplex in 2 years. I discussed in depth with the patient the nature of atherosclerosis, and emphasized the importance of maximal medical management including strict control of blood pressure, blood glucose, and lipid levels, obtaining regular exercise, and cessation of smoking.  The patient is aware that without maximal medical management the underlying atherosclerotic disease process will progress, limiting the benefit of any interventions. Consideration for repair of AAA would be made when the size is 5.0 cm, growth > 1 cm/yr, and symptomatic status. The patient was given information about stroke prevention and what symptoms should prompt the patient to seek immediate medical care. The patient was given information about AAA including signs, symptoms, treatment,  what symptoms should prompt the patient to seek immediate medical care, and how to minimize the risk of enlargement and rupture of aneurysms.  Thank you for allowing [place] to participate in this patient's care.  Clemon Chambers, RN, MSN, FNP-C [facility] Office: [phone]  Clinic MD: [facility] 04/03/2017 9:40 AM

## 2017-04-03 NOTE — Patient Instructions (Signed)
Abdominal Aortic Aneurysm Blood pumps away from the heart through tubes (blood vessels) called arteries. Aneurysms are weak or damaged places in the wall of an artery. It bulges out like a balloon. An abdominal aortic aneurysm happens in the main artery of the body (aorta). It can burst or tear, causing bleeding inside the body. This is an emergency. It needs treatment right away. What are the causes? The exact cause is unknown. Things that could cause this problem include:  Fat and other substances building up in the lining of a tube.  Swelling of the walls of a blood vessel.  Certain tissue diseases.  Belly (abdominal) trauma.  An infection in the main artery of the body.  What increases the risk? There are things that make it more likely for you to have an aneurysm. These include:  Being over the age of 77 years old.  Having high blood pressure (hypertension).  Being a male.  Being white.  Being very overweight (obese).  Having a family history of aneurysm.  Using tobacco products.  What are the signs or symptoms? Symptoms depend on the size of the aneurysm and how fast it grows. There may not be symptoms. If symptoms occur, they can include:  Pain (belly, side, lower back, or groin).  Feeling full after eating a small amount of food.  Feeling sick to your stomach (nauseous), throwing up (vomiting), or both.  Feeling a lump in your belly that feels like it is beating (pulsating).  Feeling like you will pass out (faint).  How is this treated?  Medicine to control blood pressure and pain.  Imaging tests to see if the aneurysm gets bigger.  Surgery. How is this prevented? To lessen your chance of getting this condition:  Stop smoking. Stop chewing tobacco.  Limit or avoid alcohol.  Keep your blood pressure, blood sugar, and cholesterol within normal limits.  Eat less salt.  Eat foods low in saturated fats and cholesterol. These are found in animal and  whole dairy products.  Eat more fiber. Fiber is found in whole grains, vegetables, and fruits.  Keep a healthy weight.  Stay active and exercise often.  This information is not intended to replace advice given to you by your health care provider. Make sure you discuss any questions you have with your health care provider. Document Released: 02/08/2013 Document Revised: 03/21/2016 Document Reviewed: 11/13/2012 Elsevier Interactive Patient Education  2017 [place].       Preventing Cerebrovascular Disease Arteries are blood vessels that carry blood that contains oxygen from the heart to all parts of the body. Cerebrovascular disease affects arteries that supply the brain. Any condition that blocks or disrupts blood flow to the brain can cause cerebrovascular disease. Brain cells that lose blood supply start to die within minutes (stroke). Stroke is the main danger of cerebrovascular disease. Atherosclerosis and high blood pressure are common causes of cerebrovascular disease. Atherosclerosis is narrowing and hardening of an artery that results when fat, cholesterol, calcium, or other substances (plaque) build up inside an artery. Plaque reduces blood flow through the artery. High blood pressure increases the risk of bleeding inside the brain. Making diet and lifestyle changes to prevent atherosclerosis and high blood pressure lowers your risk of cerebrovascular disease. What nutrition changes can be made?  Eat more fruits, vegetables, and whole grains.  Reduce how much saturated fat you eat. To do this, eat less red meat and fewer full-fat dairy products.  Eat healthy proteins instead of red meat.  Healthy proteins include: ? Fish. Eat fish that contains heart-healthy omega-3 fatty acids, twice a week. Examples include salmon, albacore tuna, mackerel, and herring. ? Chicken. ? Nuts. ? Low-fat or nonfat yogurt.  Avoid processed meats, like bacon and lunchmeat.  Avoid foods that  contain: ? A lot of sugar, such as sweets and drinks with added sugar. ? A lot of salt (sodium). Avoid adding extra salt to your food, as told by your health care provider. ? Trans fats, such as margarine and baked goods. Trans fats may be listed as "partially hydrogenated oils" on food labels.  Check food labels to see how much sodium, sugar, and trans fats are in foods.  Use vegetable oils that contain low amounts of saturated fat, such as olive oil or canola oil. What lifestyle changes can be made?  Drink alcohol in moderation. This means no more than 1 drink a day for nonpregnant women and 2 drinks a day for men. One drink equals 12 oz of beer, 5 oz of wine, or 1 oz of hard liquor.  If you are overweight, ask your health care provider to recommend a weight-loss plan for you. Losing 5-10 lb (2.2-4.5 kg) can reduce your risk of diabetes, atherosclerosis, and high blood pressure.  Exercise for 30?60 minutes on most days, or as much as told by your health care provider. ? Do moderate-intensity exercise, such as brisk walking, bicycling, and water aerobics. Ask your health care provider which activities are safe for you.  Do not use any products that contain nicotine or tobacco, such as cigarettes and e-cigarettes. If you need help quitting, ask your health care provider. Why are these changes important? Making these changes lowers your risk of many diseases that can cause cerebrovascular disease and stroke. Stroke is a leading cause of death and disability. Making these changes also improves your overall health and quality of life. What can I do to lower my risk? The following factors make you more likely to develop cerebrovascular disease:  Being overweight.  Smoking.  Being physically inactive.  Eating a high-fat diet.  Having certain health conditions, such as: ? Diabetes. ? High blood pressure. ? Heart disease. ? Atherosclerosis. ? High cholesterol. ? Sickle cell  disease.  Talk with your health care provider about your risk for cerebrovascular disease. Work with your health care provider to control diseases that you have that may contribute to cerebrovascular disease. Your health care provider may prescribe medicines to help prevent major causes of cerebrovascular disease. Where to find more information: Learn more about preventing cerebrovascular disease from:  [place], Lung, and [place]: MoAnalyst.de  Centers for Disease Control and Prevention: http://www.curry-wood.biz/  Summary  Cerebrovascular disease can lead to a stroke.  Atherosclerosis and high blood pressure are major causes of cerebrovascular disease.  Making diet and lifestyle changes can reduce your risk of cerebrovascular disease.  Work with your health care provider to get your risk factors under control to reduce your risk of cerebrovascular disease. This information is not intended to replace advice given to you by your health care provider. Make sure you discuss any questions you have with your health care provider. Document Released: 10/29/2015 Document Revised: 05/03/2016 Document Reviewed: 10/29/2015 Elsevier Interactive Patient Education  2018 [employer].

## 2017-04-18 NOTE — Addendum Note (Signed)
Addended by: Lianne Cure A on: 04/18/2017 10:09 AM   Modules accepted: Orders

## 2017-06-25 ENCOUNTER — Encounter (HOSPITAL_BASED_OUTPATIENT_CLINIC_OR_DEPARTMENT_OTHER): Payer: Self-pay

## 2017-06-25 ENCOUNTER — Observation Stay (HOSPITAL_BASED_OUTPATIENT_CLINIC_OR_DEPARTMENT_OTHER)
Admission: EM | Admit: 2017-06-25 | Discharge: 2017-06-27 | Disposition: A | Discharge status: Home / Self Care | Payer: Medicare Other | Attending: Family Medicine | Admitting: Family Medicine

## 2017-06-25 ENCOUNTER — Emergency Department (HOSPITAL_BASED_OUTPATIENT_CLINIC_OR_DEPARTMENT_OTHER): Payer: Medicare Other

## 2017-06-25 DIAGNOSIS — I63312 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 artery: Secondary | ICD-10-CM | POA: Diagnosis present

## 2017-06-25 DIAGNOSIS — M810 Age-related osteoporosis without current pathological fracture: Secondary | ICD-10-CM | POA: Diagnosis present

## 2017-06-25 DIAGNOSIS — R072 Precordial pain: Secondary | ICD-10-CM | POA: Diagnosis present

## 2017-06-25 DIAGNOSIS — Z7982 Long term (current) use of aspirin: Secondary | ICD-10-CM | POA: Insufficient documentation

## 2017-06-25 DIAGNOSIS — N401 Enlarged prostate with lower urinary tract symptoms: Secondary | ICD-10-CM

## 2017-06-25 DIAGNOSIS — G309 Alzheimer's disease, unspecified: Secondary | ICD-10-CM

## 2017-06-25 DIAGNOSIS — H5440 Blindness, one eye, unspecified eye: Secondary | ICD-10-CM | POA: Diagnosis present

## 2017-06-25 DIAGNOSIS — Z951 Presence of aortocoronary bypass graft: Secondary | ICD-10-CM | POA: Diagnosis not present

## 2017-06-25 DIAGNOSIS — F0280 Dementia in other diseases classified elsewhere without behavioral disturbance: Secondary | ICD-10-CM | POA: Diagnosis present

## 2017-06-25 DIAGNOSIS — K219 Gastro-esophageal reflux disease without esophagitis: Secondary | ICD-10-CM | POA: Diagnosis present

## 2017-06-25 DIAGNOSIS — Z79899 Other long term (current) drug therapy: Secondary | ICD-10-CM | POA: Insufficient documentation

## 2017-06-25 DIAGNOSIS — E7800 Pure hypercholesterolemia, unspecified: Secondary | ICD-10-CM | POA: Diagnosis present

## 2017-06-25 DIAGNOSIS — I10 Essential (primary) hypertension: Secondary | ICD-10-CM | POA: Diagnosis not present

## 2017-06-25 DIAGNOSIS — I2510 Atherosclerotic heart disease of native coronary artery without angina pectoris: Secondary | ICD-10-CM | POA: Diagnosis not present

## 2017-06-25 DIAGNOSIS — Z87891 Personal history of nicotine dependence: Secondary | ICD-10-CM | POA: Diagnosis not present

## 2017-06-25 DIAGNOSIS — N183 Chronic kidney disease, stage 3 unspecified: Secondary | ICD-10-CM | POA: Diagnosis present

## 2017-06-25 DIAGNOSIS — R079 Chest pain, unspecified: Principal | ICD-10-CM | POA: Insufficient documentation

## 2017-06-25 DIAGNOSIS — Z7902 Long term (current) use of antithrombotics/antiplatelets: Secondary | ICD-10-CM | POA: Diagnosis not present

## 2017-06-25 DIAGNOSIS — N138 Other obstructive and reflux uropathy: Secondary | ICD-10-CM | POA: Diagnosis present

## 2017-06-25 DIAGNOSIS — I25709 Atherosclerosis of coronary artery bypass graft(s), unspecified, with unspecified angina pectoris: Secondary | ICD-10-CM | POA: Diagnosis present

## 2017-06-25 DIAGNOSIS — J449 Chronic obstructive pulmonary disease, unspecified: Secondary | ICD-10-CM | POA: Diagnosis present

## 2017-06-25 DIAGNOSIS — I714 Abdominal aortic aneurysm, without rupture, unspecified: Secondary | ICD-10-CM | POA: Diagnosis present

## 2017-06-25 HISTORY — DX: Unspecified dementia, unspecified severity, without behavioral disturbance, psychotic disturbance, mood disturbance, and anxiety: F03.90

## 2017-06-25 HISTORY — DX: Atherosclerotic heart disease of native coronary artery without angina pectoris: I25.10

## 2017-06-25 HISTORY — DX: Chronic kidney disease, unspecified: N18.9

## 2017-06-25 HISTORY — DX: Essential (primary) hypertension: I10

## 2017-06-25 HISTORY — DX: Cerebral infarction, unspecified: I63.9

## 2017-06-25 LAB — CBC
HCT: 48.2 % (ref 39.0–52.0)
Hemoglobin: 15.8 g/dL (ref 13.0–17.0)
MCH: 29.5 pg (ref 26.0–34.0)
MCHC: 32.8 g/dL (ref 30.0–36.0)
MCV: 89.9 fL (ref 78.0–100.0)
Platelets: 266 10*3/uL (ref 150–400)
RBC: 5.36 MIL/uL (ref 4.22–5.81)
RDW: 14.3 % (ref 11.5–15.5)
WBC: 8.4 10*3/uL (ref 4.0–10.5)

## 2017-06-25 LAB — COMPREHENSIVE METABOLIC PANEL
ALT: 19 U/L (ref 17–63)
ANION GAP: 9 (ref 5–15)
AST: 26 U/L (ref 15–41)
Albumin: 3.9 g/dL (ref 3.5–5.0)
Alkaline Phosphatase: 104 U/L (ref 38–126)
BILIRUBIN TOTAL: 0.3 mg/dL (ref 0.3–1.2)
BUN: 20 mg/dL (ref 6–20)
CALCIUM: 9.1 mg/dL (ref 8.9–10.3)
CO2: 28 mmol/L (ref 22–32)
Chloride: 103 mmol/L (ref 101–111)
Creatinine, Ser: 1.67 mg/dL — ABNORMAL HIGH (ref 0.61–1.24)
GFR, EST AFRICAN AMERICAN: 44 mL/min — AB (ref >60)
GFR, EST NON AFRICAN AMERICAN: 38 mL/min — AB (ref >60)
Glucose, Bld: 124 mg/dL — ABNORMAL HIGH (ref 65–99)
Potassium: 4 mmol/L (ref 3.5–5.1)
Sodium: 140 mmol/L (ref 135–145)
TOTAL PROTEIN: 7.6 g/dL (ref 6.5–8.1)

## 2017-06-25 LAB — CBG MONITORING, ED: Glucose-Capillary: 134 mg/dL — ABNORMAL HIGH (ref 65–99)

## 2017-06-25 LAB — TROPONIN I: Troponin I: <0.03 ng/mL (ref <0.03)

## 2017-06-25 LAB — LIPASE, BLOOD: LIPASE: 36 U/L (ref 11–51)

## 2017-06-25 MED ORDER — GI COCKTAIL ~~LOC~~
30.0000 mL | Freq: Once | ORAL | Status: AC
  Administered 2017-06-25: 30 mL via ORAL
  Filled 2017-06-25: qty 30

## 2017-06-25 MED ORDER — ACETAMINOPHEN 325 MG PO TABS
650.0000 mg | ORAL_TABLET | Freq: Once | ORAL | Status: AC
  Administered 2017-06-25: 650 mg via ORAL
  Filled 2017-06-25: qty 2

## 2017-06-25 MED ORDER — ASPIRIN 81 MG PO CHEW
324.0000 mg | CHEWABLE_TABLET | Freq: Once | ORAL | Status: AC
  Administered 2017-06-25: 324 mg via ORAL
  Filled 2017-06-25: qty 4

## 2017-06-25 NOTE — Plan of Care (Signed)
76 yo M w HX of CAD sp CABG Complaints of persistent chest pain [facility] they want him admitted no beds  Chest pain free now   Trop negative  Anabeth Chilcott 10:24 PM Admit for observation to tele

## 2017-06-25 NOTE — ED Notes (Signed)
ED Provider at bedside. 

## 2017-06-25 NOTE — ED Provider Notes (Signed)
[place] DEPT [facility] Provider Note   CSN: [id number] Arrival date & time: 06/25/17  7829     History   Chief Complaint No chief complaint on file.   HPI Juan Montoya is a 76 y.o. male history of CAD status post CABG, TIA, carotid artery stenosis, hypertension, hyperlipidemia, kidney deficiency, abdominal aortic aneurysm presents to ED for constant, radiating chest pain 2 days. Substernal chest pain radiates to his back and up into his chest. Denies shortness of breath at rest or exertion, cough, orthopnea, palpitations, light-headedness, nausea, vomiting, diaphoresis, lower extremity swelling, fevers. Tried Mylanta prior to arrival and it did not help. Has been compliant with all his home medications.  HPI  Past Medical History:  Diagnosis Date  . AAA (abdominal aortic aneurysm) ([place])   . Carotid artery occlusion   . Coronary artery disease   . GERD (gastroesophageal reflux disease)   . Hyperlipidemia   . Hypertension   . Peripheral vascular disease ([place])   . S/P colonoscopy 01/12/12  . Stroke [facility])     Patient Active Problem List   Diagnosis Date Noted  . Chest pain 06/25/2017  . Occlusion and stenosis of carotid artery without mention of cerebral infarction 03/22/2014  . Abdominal aneurysm without mention of rupture 03/17/2012    Past Surgical History:  Procedure Laterality Date  . CORONARY ARTERY BYPASS GRAFT  2009   4 Vessel       Home Medications    Prior to Admission medications   Medication Sig Start Date End Date Taking? Authorizing Provider  amLODipine (NORVASC) 5 MG tablet Take 5 mg by mouth daily.    [provider]  aspirin EC 81 MG tablet Take 81 mg by mouth every other day.    [provider]  atorvastatin (LIPITOR) 80 MG tablet Take 80 mg by mouth daily.    [provider]  B-Complex-C-Biotin-Minerals-FA (FOLBEE PLUS CZ) 5 MG TABS TAKE 1 TABLET BY MOUTH EVERY DAY 08/24/14   [provider]  cloNIDine  (CATAPRES - DOSED IN MG/24 HR) 0.1 mg/24hr patch Take 0.1 mg by mouth at bedtime. Reported on 03/29/2016    [provider]  donepezil (ARICEPT) 10 MG tablet Take 10 mg by mouth every morning.     [provider]  febuxostat (ULORIC) 40 MG tablet Take 40 mg by mouth.    [provider]  indomethacin (INDOCIN) 50 MG capsule Take 50 mg by mouth as needed. Reported on 03/29/2016 01/01/12   [provider]  memantine (NAMENDA XR) 28 MG CP24 24 hr capsule Take 28 mg by mouth daily. 11/02/14   [provider]  metoprolol succinate (TOPROL-XL) 25 MG 24 hr tablet Take 12.5 mg by mouth 2 (two) times daily.     [provider]  omeprazole (PRILOSEC) 20 MG capsule Take 20 mg by mouth daily.    [provider]  pravastatin (PRAVACHOL) 80 MG tablet Take 80 mg by mouth at bedtime. Reported on 03/29/2016 03/08/12   [provider]  Probiotic Product (PROBIOTIC & ACIDOPHILUS EX ST PO) Take 1 tablet by mouth.    [provider]  ranolazine (RANEXA) 500 MG 12 hr tablet Take 500 mg by mouth 2 (two) times daily.    [provider]  simvastatin (ZOCOR) 20 MG tablet Take 20 mg by mouth every evening. Reported on 03/29/2016    [provider]  sodium bicarbonate 650 MG tablet Take 650 mg by mouth daily.     [provider]  solifenacin (VESICARE) 10 MG tablet Take 5 mg by mouth at bedtime.    [provider]  Tamsulosin HCl (FLOMAX) 0.4 MG CAPS Take 0.4 mg by mouth daily.    [provider]    Family History Family History  Problem Relation Age of Onset  . Coronary artery disease Mother     Social History Social History  Substance Use Topics  . Smoking status: Former Smoker    Quit date: 06/27/2008  . Smokeless tobacco: Never Used  . Alcohol use 4.2 oz/week    7 Cans of beer per week     Allergies   Codeine; Ace inhibitors; and Hctz [hydrochlorothiazide]   Review of Systems Review of Systems   Constitutional: Negative for chills and fever.  Respiratory: Negative for cough, chest tightness and shortness of breath.   Cardiovascular: Positive for chest pain. Negative for palpitations and leg swelling.  Gastrointestinal: Negative for abdominal pain, constipation, diarrhea, nausea and vomiting.  Genitourinary: Negative for difficulty urinating, dysuria and hematuria.  Musculoskeletal: Negative for myalgias.  Skin: Negative for rash.  Neurological: Negative for syncope, weakness, light-headedness, numbness and headaches.     Physical Exam Updated Vital Signs BP (!) 170/77 (BP Location: Left Arm)   Pulse (!) 56   Temp 98.3 F (36.8 C) (Oral)   Resp 20   SpO2 93%   Physical Exam  Constitutional: He is oriented to person, place, and time. He appears well-developed and well-nourished. No distress.  HENT:  Head: Normocephalic and atraumatic.  Nose: Nose normal.  Mouth/Throat: Oropharynx is clear and moist. No oropharyngeal exudate.  Eyes: Pupils are equal, round, and reactive to light. Conjunctivae and EOM are normal.  Neck: Normal range of motion. Neck supple.  Trachea midline  Cardiovascular: Normal rate, regular rhythm, S1 normal, S2 normal, normal heart sounds and intact distal pulses.   No murmur heard. Pulses:      Carotid pulses are 2+ on the right side, and 2+ on the left side.      Radial pulses are 2+ on the right side, and 2+ on the left side.       Dorsalis pedis pulses are 2+ on the right side, and 2+ on the left side.       Posterior tibial pulses are 2+ on the right side, and 2+ on the left side.  No lower extremity edema No orthopnea  Pulmonary/Chest: Effort normal and breath sounds normal. No respiratory distress. He has no decreased breath sounds. He has no wheezes. He has no rales.  No chest wall tenderness  Abdominal: Soft. Bowel sounds are normal. He exhibits no distension. There is no tenderness.  Abdomen is soft and nondistended, no obvious pulsating  masses  Musculoskeletal: Normal range of motion. He exhibits no deformity.  Lymphadenopathy:    He has no cervical adenopathy.  Neurological: He is alert and oriented to person, place, and time.  Skin: Skin is warm and dry. Capillary refill takes less than 2 seconds.  Psychiatric: He has a normal mood and affect. His behavior is normal. Judgment and thought content normal.  Nursing note and vitals reviewed.    ED Treatments / Results  Labs (all labs ordered are listed, but only abnormal results are displayed) Labs Reviewed  COMPREHENSIVE METABOLIC PANEL - Abnormal; Notable for the following:       Result Value   Glucose, Bld 124 (*)    Creatinine, Ser 1.67 (*)    GFR calc non Af Amer 38 (*)  GFR calc Af Amer 44 (*)    All other components within normal limits  CBG MONITORING, ED - Abnormal; Notable for the following:    Glucose-Capillary 134 (*)    All other components within normal limits  TROPONIN I  TROPONIN I  CBC  LIPASE, BLOOD    EKG  EKG Interpretation  Date/Time:  Wednesday June 25 2017 19:00:43 EDT Ventricular Rate:  60 PR Interval:    QRS Duration: 93 QT Interval:  448 QTC Calculation: 448 R Axis:   47 Text Interpretation:  Sinus rhythm t wave flattening in I and aVL Otherwise no significant change Confirmed by Deno Etienne [phone]) on 06/25/2017 8:19:42 PM       Radiology Dg Chest 2 View  Result Date: 06/25/2017 CLINICAL DATA:  Mid chest pain radiating to back with sudden onset yesterday, history hypertension, coronary disease post CABG, stroke, former smoker EXAM: CHEST  2 VIEW COMPARISON:  10/16/2015 FINDINGS: Normal heart size post CABG. Atherosclerotic calcification aorta. Minimal scarring LEFT mid lung. Emphysematous changes without infiltrate, pleural effusion or pneumothorax. No acute bony findings. IMPRESSION: Post CABG. Emphysematous changes and minimal LEFT lung scarring without acute infiltrate. Electronically Signed   By: Lavonia Dana M.D.   On:  06/25/2017 19:27    Procedures Procedures (including critical care time)  Medications Ordered in ED Medications  aspirin chewable tablet 324 mg (324 mg Oral Given 06/25/17 1914)  gi cocktail (Maalox,Lidocaine,Donnatal) (30 mLs Oral Given 06/25/17 2030)  acetaminophen (TYLENOL) tablet 650 mg (650 mg Oral Given 06/25/17 2035)     Initial Impression / Assessment and Plan / ED Course  I have reviewed the triage vital signs and the nursing notes.  Pertinent labs & imaging results that were available during my care of the patient were reviewed by me and considered in my medical decision making (see chart for details).  Clinical Course as of Jun 26 2355  Wed Jun 25, 2017  2224 Dr. Roel Cluck  [CG]    Clinical Course User Index [CG] Kinnie Feil, PA-C   Pt is a 76 y.o. male with h/o CAD status post CABG, TIA, carotid artery stenosis, hypertension, hyperlipidemia, kidney deficiency, abdominal aortic aneurysm presents with CP.  Symptoms have been constant, radiate up chest and to center of back.  Initially endorsed CP in ED, but has improved with GI cocktail and tylenol. Pertinent risk factors include HTN, hypercholesterolemia, known CAD (CABG, TIA).  On exam VS are wnl. Cardiovascular and pulmonary exam benign. CXR, EKG, troponin x 2 within normal limits.  CBC and BMP unremarkable. Considered PE however hx and exam not suggestive of this. Heart score = 4.  Pt was given Gi cocktail, tylenol and aspirin in ED. Spoke to [place] cardiologist Dr Marijo File who recommends admission for observation. [facility] does not have beds, will request admission to [facility]. Discussed ED work up and decision to admit with patient, wife and daughter at bedside who agreed. Cardiologist is Dr. Bishop Limbo at [place].  Patient, ED treatment and discharge plan was discussed with supervising physician who is agreeable with plan.    Final Clinical Impressions(s) / ED Diagnoses   Final diagnoses:  Chest pain in adult    New  Prescriptions New Prescriptions   No medications on file     Arlean Hopping 06/25/17 [place], [place], DO 06/25/17 2359

## 2017-06-25 NOTE — ED Triage Notes (Signed)
Pt c/o center chest pain radiating through to center back.

## 2017-06-26 ENCOUNTER — Observation Stay (HOSPITAL_BASED_OUTPATIENT_CLINIC_OR_DEPARTMENT_OTHER): Payer: Medicare Other

## 2017-06-26 ENCOUNTER — Encounter (HOSPITAL_COMMUNITY): Payer: Self-pay | Admitting: Physician Assistant

## 2017-06-26 DIAGNOSIS — F0390 Unspecified dementia without behavioral disturbance: Secondary | ICD-10-CM

## 2017-06-26 DIAGNOSIS — N183 Chronic kidney disease, stage 3 (moderate): Secondary | ICD-10-CM | POA: Diagnosis not present

## 2017-06-26 DIAGNOSIS — K219 Gastro-esophageal reflux disease without esophagitis: Secondary | ICD-10-CM | POA: Diagnosis present

## 2017-06-26 DIAGNOSIS — I25700 Atherosclerosis of coronary artery bypass graft(s), unspecified, with unstable angina pectoris: Secondary | ICD-10-CM

## 2017-06-26 DIAGNOSIS — I951 Orthostatic hypotension: Secondary | ICD-10-CM | POA: Diagnosis not present

## 2017-06-26 DIAGNOSIS — E7800 Pure hypercholesterolemia, unspecified: Secondary | ICD-10-CM

## 2017-06-26 DIAGNOSIS — R079 Chest pain, unspecified: Secondary | ICD-10-CM

## 2017-06-26 DIAGNOSIS — I10 Essential (primary) hypertension: Secondary | ICD-10-CM | POA: Diagnosis not present

## 2017-06-26 DIAGNOSIS — R072 Precordial pain: Secondary | ICD-10-CM | POA: Diagnosis not present

## 2017-06-26 LAB — LIPID PANEL
CHOL/HDL RATIO: 5.1 ratio
CHOLESTEROL: 143 mg/dL (ref 0–200)
HDL: 28 mg/dL — AB (ref >40)
LDL Cholesterol: 61 mg/dL (ref 0–99)
TRIGLYCERIDES: 271 mg/dL — AB (ref <150)
VLDL: 54 mg/dL — ABNORMAL HIGH (ref 0–40)

## 2017-06-26 LAB — NM MYOCAR MULTI W/SPECT W/WALL MOTION / EF
CHL CUP RESTING HR STRESS: 54 {beats}/min
CSEPEW: 1 METS
CSEPPHR: 64 {beats}/min
Exercise duration (min): 5 min
Exercise duration (sec): 0 s

## 2017-06-26 LAB — TROPONIN I
Troponin I: <0.03 ng/mL (ref <0.03)
Troponin I: <0.03 ng/mL (ref <0.03)
Troponin I: <0.03 ng/mL (ref <0.03)
Troponin I: <0.03 ng/mL (ref <0.03)

## 2017-06-26 LAB — ECHOCARDIOGRAM COMPLETE
Height: 66 in
Weight: 2377.6 oz

## 2017-06-26 LAB — HEMOGLOBIN A1C
Hgb A1c MFr Bld: 6.4 % — ABNORMAL HIGH (ref 4.8–5.6)
Mean Plasma Glucose: 136.98 mg/dL

## 2017-06-26 MED ORDER — REGADENOSON 0.4 MG/5ML IV SOLN
INTRAVENOUS | Status: AC
  Administered 2017-06-26: 0.4 mg
  Filled 2017-06-26: qty 5

## 2017-06-26 MED ORDER — METOPROLOL SUCCINATE ER 25 MG PO TB24
12.5000 mg | ORAL_TABLET | Freq: Two times a day (BID) | ORAL | Status: DC
  Administered 2017-06-26 – 2017-06-27 (×3): 12.5 mg via ORAL
  Filled 2017-06-26 (×3): qty 1

## 2017-06-26 MED ORDER — GI COCKTAIL ~~LOC~~: 30.0000 mL | Freq: Two times a day (BID) | ORAL | Status: DC | PRN

## 2017-06-26 MED ORDER — MEMANTINE HCL ER 28 MG PO CP24
28.0000 mg | ORAL_CAPSULE | Freq: Every day | ORAL | Status: DC
  Administered 2017-06-26 – 2017-06-27 (×2): 28 mg via ORAL
  Filled 2017-06-26 (×2): qty 1

## 2017-06-26 MED ORDER — REGADENOSON 0.4 MG/5ML IV SOLN
0.4000 mg | Freq: Once | INTRAVENOUS | Status: DC
  Filled 2017-06-26: qty 5

## 2017-06-26 MED ORDER — HEPARIN SODIUM (PORCINE) 5000 UNIT/ML IJ SOLN
5000.0000 [IU] | Freq: Three times a day (TID) | INTRAMUSCULAR | Status: DC
  Administered 2017-06-26 – 2017-06-27 (×3): 5000 [IU] via SUBCUTANEOUS
  Filled 2017-06-26 (×3): qty 1

## 2017-06-26 MED ORDER — GI COCKTAIL ~~LOC~~: 30.0000 mL | Freq: Four times a day (QID) | ORAL | Status: DC | PRN

## 2017-06-26 MED ORDER — DARIFENACIN HYDROBROMIDE ER 7.5 MG PO TB24
7.5000 mg | ORAL_TABLET | Freq: Every day | ORAL | Status: DC
  Administered 2017-06-26: 7.5 mg via ORAL
  Filled 2017-06-26 (×2): qty 1

## 2017-06-26 MED ORDER — ONDANSETRON HCL 4 MG/2ML IJ SOLN: 4.0000 mg | Freq: Four times a day (QID) | INTRAMUSCULAR | Status: DC | PRN

## 2017-06-26 MED ORDER — ASPIRIN EC 81 MG PO TBEC
81.0000 mg | DELAYED_RELEASE_TABLET | ORAL | Status: DC
  Administered 2017-06-26: 81 mg via ORAL
  Filled 2017-06-26: qty 1

## 2017-06-26 MED ORDER — ALPRAZOLAM 0.25 MG PO TABS: 0.2500 mg | ORAL_TABLET | Freq: Two times a day (BID) | ORAL | Status: DC | PRN

## 2017-06-26 MED ORDER — METOPROLOL SUCCINATE ER 25 MG PO TB24: 12.5000 mg | ORAL_TABLET | Freq: Two times a day (BID) | ORAL | Status: DC

## 2017-06-26 MED ORDER — ATORVASTATIN CALCIUM 80 MG PO TABS
80.0000 mg | ORAL_TABLET | Freq: Every day | ORAL | Status: DC
  Administered 2017-06-26 – 2017-06-27 (×2): 80 mg via ORAL
  Filled 2017-06-26 (×2): qty 1

## 2017-06-26 MED ORDER — TECHNETIUM TC 99M TETROFOSMIN IV KIT
30.0000 | PACK | Freq: Once | INTRAVENOUS | Status: AC | PRN
  Administered 2017-06-26: 30 via INTRAVENOUS

## 2017-06-26 MED ORDER — AMLODIPINE BESYLATE 10 MG PO TABS
10.0000 mg | ORAL_TABLET | Freq: Every day | ORAL | Status: DC
Start: 2017-06-26 — End: 2017-06-27
  Administered 2017-06-26 – 2017-06-27 (×2): 10 mg via ORAL
  Filled 2017-06-26: qty 1

## 2017-06-26 MED ORDER — DARIFENACIN HYDROBROMIDE ER 15 MG PO TB24: 15.0000 mg | ORAL_TABLET | Freq: Every day | ORAL | Status: DC

## 2017-06-26 MED ORDER — FEBUXOSTAT 40 MG PO TABS
40.0000 mg | ORAL_TABLET | Freq: Every day | ORAL | Status: DC
  Administered 2017-06-26 – 2017-06-27 (×2): 40 mg via ORAL
  Filled 2017-06-26 (×2): qty 1

## 2017-06-26 MED ORDER — DONEPEZIL HCL 10 MG PO TABS
10.0000 mg | ORAL_TABLET | Freq: Every morning | ORAL | Status: DC
  Administered 2017-06-26 – 2017-06-27 (×2): 10 mg via ORAL
  Filled 2017-06-26 (×2): qty 1

## 2017-06-26 MED ORDER — HYDRALAZINE HCL 20 MG/ML IJ SOLN
10.0000 mg | INTRAMUSCULAR | Status: DC | PRN
  Filled 2017-06-26: qty 1

## 2017-06-26 MED ORDER — TECHNETIUM TC 99M TETROFOSMIN IV KIT
10.0000 | PACK | Freq: Once | INTRAVENOUS | Status: AC | PRN
  Administered 2017-06-26: 10 via INTRAVENOUS

## 2017-06-26 MED ORDER — AMLODIPINE BESYLATE 5 MG PO TABS
5.0000 mg | ORAL_TABLET | Freq: Every day | ORAL | Status: DC
  Filled 2017-06-26: qty 1

## 2017-06-26 MED ORDER — DARIFENACIN HYDROBROMIDE ER 7.5 MG PO TB24
7.5000 mg | ORAL_TABLET | Freq: Every day | ORAL | Status: DC
  Filled 2017-06-26: qty 1

## 2017-06-26 MED ORDER — TAMSULOSIN HCL 0.4 MG PO CAPS
0.4000 mg | ORAL_CAPSULE | Freq: Every day | ORAL | Status: DC
  Administered 2017-06-26 – 2017-06-27 (×2): 0.4 mg via ORAL
  Filled 2017-06-26 (×2): qty 1

## 2017-06-26 MED ORDER — SODIUM BICARBONATE 650 MG PO TABS
650.0000 mg | ORAL_TABLET | Freq: Every day | ORAL | Status: DC
  Administered 2017-06-26 – 2017-06-27 (×2): 650 mg via ORAL
  Filled 2017-06-26 (×2): qty 1

## 2017-06-26 MED ORDER — SODIUM CHLORIDE 0.9 % IV SOLN
INTRAVENOUS | Status: DC
  Administered 2017-06-26 – 2017-06-27 (×3): via INTRAVENOUS

## 2017-06-26 MED ORDER — PANTOPRAZOLE SODIUM 40 MG PO TBEC
40.0000 mg | DELAYED_RELEASE_TABLET | Freq: Every day | ORAL | Status: DC
  Administered 2017-06-26 – 2017-06-27 (×2): 40 mg via ORAL
  Filled 2017-06-26 (×2): qty 1

## 2017-06-26 NOTE — Progress Notes (Signed)
       Juan Montoya presented for a nuclear stress test today.  No immediate complications.  Stress imaging is pending at this time.  Preliminary EKG findings may be listed in the chart, but the stress test result will not be finalized until perfusion imaging is complete.  Linus Mako, PA-C 06/26/2017, 11:58 AM

## 2017-06-26 NOTE — Progress Notes (Signed)
This note also relates to the following rows which could not be included: ECG Heart Rate - Cannot attach notes to unvalidated device data  7 mins, Tiffany PA at bedside. Pt denies CP/SOB. Testing ended

## 2017-06-26 NOTE — Progress Notes (Signed)
This note also relates to the following rows which could not be included: ECG Heart Rate - Cannot attach notes to unvalidated device data  5 mins, Tiffany PA at bedside. Pt denies CP/SOB

## 2017-06-26 NOTE — CV Procedure (Signed)
2D echo attempted, but patient off floor getting Lexiscan

## 2017-06-26 NOTE — Consult Note (Signed)
Cardiology Consultation:   Patient ID: Juan Montoya; [id number]; [birth year]   Admit date: 06/25/2017 Date of Consult: 06/26/2017  Primary Care Provider: Bernerd Limbo, MD Primary Cardiologist: Dolan Amen, DO @ [facility]   Patient Profile:   Juan Montoya is a 76 y.o. male with a hx of CAD, Abdominal aortic aneurysm followed by Dr. Donnetta Hutching (last seen 03/2016 - stable 4.6cm in 2014), carotid artery stenosis, palpitation (unremarkable holter monitor), hypertension, hyperlipidemia, chronic kidney disease stage III, dementia, TIA  and GERD who is being seen today for the evaluation of Chest pain  at the request of Dr. Aggie Moats.   Hx of Coronary artery disease with prior CABG in 2009 with LIMA to the LAD and vein grafts to the diagonal, obtuse marginal, and PDA. In August 2016, he had a heart catheterization showing severe stenosis of his native RCA and occlusion of the vein graft to the RCA. This was treated with stenting to the RCA with a good result. He has severe disease of his bypass grafts otherwise. This includes an atretic LIMA, borderline stenosis of the Y graft to OM 2 and OM 3 and also borderline stenosis of the native ostial LAD.   Last Lexiscan normal 07/2016.   History of Present Illness:   Juan Montoya presented to [facility] [place] yesterday with 1 days hx of lower mid sternal/epigastic chest pain. Patient is poor historian. Wife at bedside provided hx. He described the pain as sharp/burning sensation. Radiating to the neck as well as back. He does volunteer work. No exacerbation of symptoms with activity. Denies associated shortness of breath, nausea, vomiting or diaphoresis. He cannot take nitroglycerin due to migraine headache.  Tried Mylanta prior to arrival and it did not help. Recent denies fever, chills, cough, congestion, palpitation, orthopnea, PND, syncope, lower extremity edema or melena.  Discussed with cardiologist @ [facility]. Admitted for observation at  [facility] is no bed available at [facility]. EKG shows normal sinus rhythm with T-wave flattening in lead 1 and aVL. No prior EKG to compare- personally reviewed. Telemetry showed sinus bradycardia at rate of 50s- personally reviewed. - personally reviewed.   Troponin negative x 3.  Scr 1.67. CXR without acute findings.   Received GI cocktail yesterday in ER. Chest pain free since here.   Past Medical History:  Diagnosis Date  . AAA (abdominal aortic aneurysm) ([place])   . Carotid artery occlusion   . Chronic kidney disease   . Coronary artery disease   . Dementia   . GERD (gastroesophageal reflux disease)   . Hyperlipidemia   . Hypertension   . Peripheral vascular disease ([place])   . S/P colonoscopy 01/12/12  . Stroke [facility])     Past Surgical History:  Procedure Laterality Date  . CARDIAC CATHETERIZATION  05/2015  . CORONARY ARTERY BYPASS GRAFT  2009   4 Vessel  . MASTOIDECTOMY       Inpatient Medications: Scheduled Meds: . amLODipine  5 mg Oral Daily  . aspirin EC  81 mg Oral QODAY  . atorvastatin  80 mg Oral Daily  . darifenacin  15 mg Oral Daily  . donepezil  10 mg Oral q morning - 10a  . febuxostat  40 mg Oral Daily  . heparin  5,000 Units Subcutaneous Q8H  . memantine  28 mg Oral Daily  . metoprolol succinate  12.5 mg Oral BID  . pantoprazole  40 mg Oral Daily  . sodium bicarbonate  650 mg Oral Daily  .  tamsulosin  0.4 mg Oral Daily   Continuous Infusions: . sodium chloride     PRN Meds: ALPRAZolam, gi cocktail, hydrALAZINE, ondansetron (ZOFRAN) IV  Allergies:    Allergies  Allergen Reactions  . Ace Inhibitors Other (See Comments)    hyponatremia hyponatremia  . Codeine Itching    nausea  . Hctz [Hydrochlorothiazide] Other (See Comments)    hyponatremia hyponatremia    Social History:   Social History   Social History  . Marital status: Married    Spouse name: N/A  . Number of children: N/A  . Years of education: N/A   Occupational History  .  Not on file.   Social History Main Topics  . Smoking status: Former Smoker    Quit date: 06/27/2008  . Smokeless tobacco: Never Used  . Alcohol use 4.2 oz/week    7 Cans of beer per week  . Drug use: No  . Sexual activity: No   Other Topics Concern  . Not on file   Social History Narrative  . No narrative on file    Family History:    Family History  Problem Relation Age of Onset  . Coronary artery disease Mother      ROS:  Please see the history of present illness.  Review of Systems  Endocrine: Positive for polyuria.   All other ROS reviewed and negative.     Physical Exam/Data:   Vitals:   06/26/17 0356 06/26/17 0400 06/26/17 0415 06/26/17 0614  BP:  (!) 198/72 (!) 198/82 (!) 158/51  Pulse:  (!) 57  (!) 58  Resp:      Temp:  97.8 F (36.6 C)    TempSrc:  Oral    SpO2:  96%    Weight: 148 lb 9.6 oz (67.4 kg)     Height: 5\' 6"  (1.676 m)       Intake/Output Summary (Last 24 hours) at 06/26/17 0800 Last data filed at 06/26/17 0700  Gross per 24 hour  Intake                0 ml  Output              650 ml  Net             -650 ml   Filed Weights   06/26/17 0356  Weight: 148 lb 9.6 oz (67.4 kg)   Body mass index is 23.98 kg/m.  General:  Well nourished, well developed, in no acute distress HEENT: normal Lymph: no adenopathy Neck: no JVD Endocrine:  No thryomegaly Vascular: No carotid bruits; FA pulses 2+ bilaterally without bruits  Cardiac:  normal S1, S2; RRR; no murmur  Lungs:  clear to auscultation bilaterally, no wheezing, rhonchi or rales  Abd: soft, nontender, no hepatomegaly  Ext: no edema Musculoskeletal:  No deformities, BUE and BLE strength normal and equal Skin: warm and dry  Neuro:  CNs 2-12 intact, no focal abnormalities noted Psych:  Normal affect     Relevant CV Studies: 48 hour holter monitor 01/2017 Rhythm Findings:  1.Ventricular: There was no sustained ventricular arrhythmia. There were rare  PVCs.  2.Supraventricular: There was no sustained atrial arrhythmia. There were rare PACs.  3.Bradyarrhythmias and Pauses: None  Symptoms reported: The patient reported shortness of breath and chest tightness on one occasion each. During these times, the heart rhythm was normal  Laboratory Data:  Chemistry Recent Labs Lab 06/25/17 1903  NA 140  K 4.0  CL 103  CO2 28  GLUCOSE 124*  BUN 20  CREATININE 1.67*  CALCIUM 9.1  GFRNONAA 38*  GFRAA 44*  ANIONGAP 9     Recent Labs Lab 06/25/17 1903  PROT 7.6  ALBUMIN 3.9  AST 26  ALT 19  ALKPHOS 104  BILITOT 0.3   Hematology Recent Labs Lab 06/25/17 1903  WBC 8.4  RBC 5.36  HGB 15.8  HCT 48.2  MCV 89.9  MCH 29.5  MCHC 32.8  RDW 14.3  PLT 266   Cardiac Enzymes Recent Labs Lab 06/25/17 1903 06/25/17 2241 06/26/17 0616  TROPONINI <0.03 <0.03 <0.03   No results for input(s): TROPIPOC in the last 168 hours.   Radiology/Studies:  Dg Chest 2 View  Result Date: 06/25/2017 CLINICAL DATA:  Mid chest pain radiating to back with sudden onset yesterday, history hypertension, coronary disease post CABG, stroke, former smoker EXAM: CHEST  2 VIEW COMPARISON:  10/16/2015 FINDINGS: Normal heart size post CABG. Atherosclerotic calcification aorta. Minimal scarring LEFT mid lung. Emphysematous changes without infiltrate, pleural effusion or pneumothorax. No acute bony findings. IMPRESSION: Post CABG. Emphysematous changes and minimal LEFT lung scarring without acute infiltrate. Electronically Signed   By: Lavonia Dana M.D.   On: 06/25/2017 19:27    Assessment and Plan:   1. Chest pain with prior hx of CABG - Seems atypical presentation. Chest pain persistent for > 24 hours prior to presentation. Did not exacerbated with activity.  Unable to tell if symptoms is similar to prior angina or not. Pain radiating to neck and back. Did not improved in Mylanta. However chest pain free since here. Did received GI cocktail in ER.  Troponin x 3 negative. EKG with T wave flattening in lead I and aVL. No prior EKG to compare.  Lats stress test low risk 07/2016. Last cath in 2016 as discussed above. Keep NPO. Will review with MD.   2. HTN - Uncontrolled. Elevated here. Compliant with medication. Up - titrate amlodipine to 10mg  qd. Continue low dose BB (Baseline bradycardic at rate of 50s)   3. AAA  - Followed by Dr. Donnetta Hutching   Signed, [place], PA  06/26/2017 8:00 AM   I have seen and examined the patient along with Bhagat,Bhavinkumar, [place] .  I have reviewed the chart, notes and new data.  I agree with PA's note.  Key new complaints: Symptoms are similar but less intense than previous episodes of angina and overall presentation is consistent with unstable angina. However, he has difficulty distinguishing his cardiac symptoms from those of acid reflux, which also cause problems in the past. Yesterday symptoms were not relieved with antacids. There were also associated with a marked an unusual elevation in blood pressure. Amlodipine was discontinued earlier this year, then restarted at a lower dose of 2.5 mg daily just a few days before this presentation. Key examination changes: no arrhythmia or overt CHF Key new findings / data: ECG shows sinus bradycardia and no repolarization abnormalities. Low risk cardiac enzymes.  PLAN: Uncertain whether this is truly unstable angina, not in small part due to the fact that he is a very poor historian. His wife is a retired Marine scientist and is an Youth worker, but obviously cannot really relate his symptoms. Offered option for direct invasive evaluation with angiography versus initial risk stratification with a nuclear perfusion study. After many questions, settled on the second option. We'll plan for a [employer] today. If this study shows a sizable area of reversible ischemia he should undergo coronary angiography and appropriate revascularization tomorrow. If  the nuclear  study is normal with increase the amlodipine to the previous dose of 5 mg, enhance anti-reflux medications and arrange follow-up visit with his usual cardiologist, Dr. Quillian Quince. Cannot increase the dose of beta blocker due to relative bradycardia. Ranexa has been employed in the past and stopped, not sure why. Orthostatic hypotension has been a problem which may limit the use of long-acting nitrates.  Sanda Klein, MD, [place] [phone] 06/26/2017, 9:22 AM

## 2017-06-26 NOTE — Progress Notes (Signed)
This note also relates to the following rows which could not be included: ECG Heart Rate - Cannot attach notes to unvalidated device data  3 mins, Tiffany PA at bedside, pt reports some SOB, denies CP

## 2017-06-26 NOTE — Progress Notes (Signed)
  Echocardiogram 2D Echocardiogram has been performed.  Juan Montoya 06/26/2017, 2:13 PM

## 2017-06-26 NOTE — H&P (Signed)
History and Physical    Juan LANDRIGAN Montoya:[id number] DOB: [birth year] DOA: 06/25/2017   PCP: Bernerd Limbo, MD   Patient coming from:  Home    Chief Complaint: Chest pain   HPI: Juan Montoya is a 76 y.o. male with medical history significant for Alzheimer's dementia, TIA, Carotid artery stenosis, HTN, HLD, CKD, AAA followed at [facility] Dr Wyvonnia Dusky s/p CABG presented to [facility] with persistent, 2 day history of  chest pain, radiating to the upper chest and to the interscapular area, non exertional.  History is obtained by his wife, a former nurse. Per her report, he is unsure if symptoms are similar to those prior to CABG.  Pain notworsened with deep inspiration, movement or exertion.  Denies any dizziness or falls. No syncope or presyncope.Last visit to his Cardiologist on 01/2017. A Holter monitor was placed at the time for palpitations with no acute findings .Last stress test 07/2016. No shortness of breath or cough. Denies any fever or chills. Denies any nausea, vomiting or abdominal pain. Appetite is normal. Denies any leg swelling or calf pain. Denies any headaches or vision changes. Denies any seizures No recent long distance trips.  No new meds. Not on hormonal therapy.  No new herbal supplements. He is compliant with his meds  No bleeding issues. No tobacco, ETOH or recreational drugs.  ED Course:  BP (!) 158/51 (BP Location: Left Arm)   Pulse (!) 58   Temp 97.8 F (36.6 C) (Oral)   Resp 14   Ht 5\' 6"  (1.676 m)   Wt 67.4 kg (148 lb 9.6 oz)   SpO2 96%   BMI 23.98 kg/m   Given ASA 324 mg, GI cocktail with some relief  glu 124 Cr 1.67 w GFR 30s  Tn neg  CXR NAD EKG Sinus rhythm t wave flattening in I and aVL Otherwise no significant change    Review of Systems:  As per HPI otherwise all other systems reviewed and are negative  Past Medical History:  Diagnosis Date  . AAA (abdominal aortic aneurysm) ([place])   . Carotid artery occlusion   . Chronic kidney disease   . Coronary  artery disease   . Dementia   . GERD (gastroesophageal reflux disease)   . Hyperlipidemia   . Hypertension   . Peripheral vascular disease ([place])   . S/P colonoscopy 01/12/12  . Stroke [facility])     Past Surgical History:  Procedure Laterality Date  . CORONARY ARTERY BYPASS GRAFT  2009   4 Vessel    Social History Social History   Social History  . Marital status: Married    Spouse name: N/A  . Number of children: N/A  . Years of education: N/A   Occupational History  . Not on file.   Social History Main Topics  . Smoking status: Former Smoker    Quit date: 06/27/2008  . Smokeless tobacco: Never Used  . Alcohol use 4.2 oz/week    7 Cans of beer per week  . Drug use: No  . Sexual activity: No   Other Topics Concern  . Not on file   Social History Narrative  . No narrative on file     Allergies  Allergen Reactions  . Ace Inhibitors Other (See Comments)    hyponatremia hyponatremia  . Codeine Itching    nausea  . Hctz [Hydrochlorothiazide] Other (See Comments)    hyponatremia hyponatremia    Family History  Problem Relation Age of Onset  . Coronary  artery disease Mother       Prior to Admission medications   Medication Sig Start Date End Date Taking? Authorizing Provider  amLODipine (NORVASC) 5 MG tablet Take 5 mg by mouth daily.    [provider]  aspirin EC 81 MG tablet Take 81 mg by mouth every other day.    [provider]  atorvastatin (LIPITOR) 80 MG tablet Take 80 mg by mouth daily.    [provider]  B-Complex-C-Biotin-Minerals-FA (FOLBEE PLUS CZ) 5 MG TABS TAKE 1 TABLET BY MOUTH EVERY DAY 08/24/14   [provider]  cloNIDine (CATAPRES - DOSED IN MG/24 HR) 0.1 mg/24hr patch Take 0.1 mg by mouth at bedtime. Reported on 03/29/2016    [provider]  donepezil (ARICEPT) 10 MG tablet Take 10 mg by mouth every morning.     [provider]  febuxostat (ULORIC) 40 MG tablet Take 40 mg by mouth.     [provider]  indomethacin (INDOCIN) 50 MG capsule Take 50 mg by mouth as needed. Reported on 03/29/2016 01/01/12   [provider]  memantine (NAMENDA XR) 28 MG CP24 24 hr capsule Take 28 mg by mouth daily. 11/02/14   [provider]  metoprolol succinate (TOPROL-XL) 25 MG 24 hr tablet Take 12.5 mg by mouth 2 (two) times daily.     [provider]  omeprazole (PRILOSEC) 20 MG capsule Take 20 mg by mouth daily.    [provider]  pravastatin (PRAVACHOL) 80 MG tablet Take 80 mg by mouth at bedtime. Reported on 03/29/2016 03/08/12   [provider]  Probiotic Product (PROBIOTIC & ACIDOPHILUS EX ST PO) Take 1 tablet by mouth.    [provider]  ranolazine (RANEXA) 500 MG 12 hr tablet Take 500 mg by mouth 2 (two) times daily.    [provider]  simvastatin (ZOCOR) 20 MG tablet Take 20 mg by mouth every evening. Reported on 03/29/2016    [provider]  sodium bicarbonate 650 MG tablet Take 650 mg by mouth daily.     [provider]  solifenacin (VESICARE) 10 MG tablet Take 5 mg by mouth at bedtime.    [provider]  Tamsulosin HCl (FLOMAX) 0.4 MG CAPS Take 0.4 mg by mouth daily.    [provider]    Physical Exam:  Vitals:   06/26/17 0356 06/26/17 0400 06/26/17 0415 06/26/17 0614  BP:  (!) 198/72 (!) 198/82 (!) 158/51  Pulse:  (!) 57  (!) 58  Resp:      Temp:  97.8 F (36.6 C)    TempSrc:  Oral    SpO2:  96%    Weight: 67.4 kg (148 lb 9.6 oz)     Height: 5\' 6"  (1.676 m)      Constitutional: NAD, calm, comfortable   Eyes: PERRL, lids and conjunctivae normal ENMT: Mucous membranes are moist, without exudate or lesions  Neck: normal, supple, no masses, no thyromegaly Respiratory: clear to auscultation bilaterally, no wheezing, no crackles. Normal respiratory effort  Cardiovascular: Regular rate and rhythm, occasional ectopic beat, 1/6 systolic murmur, rubs or gallops. No extremity  edema. 2+ pedal pulses. No carotid bruits.  Abdomen: Soft, non tender, No hepatosplenomegaly. Bowel sounds positive.  Musculoskeletal: no clubbing / cyanosis. Moves all extremities Skin: no jaundice, No lesions.  Neurologic: Sensation intact  Strength equal in all extremities Psychiatric:   Alert and oriented to person, not to date or place . Normal mood.  Labs on Admission: I have personally reviewed following labs and imaging studies  CBC:  Recent Labs Lab 06/25/17 1903  WBC 8.4  HGB 15.8  HCT 48.2  MCV 89.9  PLT 161    Basic Metabolic Panel:  Recent Labs Lab 06/25/17 1903  NA 140  K 4.0  CL 103  CO2 28  GLUCOSE 124*  BUN 20  CREATININE 1.67*  CALCIUM 9.1    GFR: Estimated Creatinine Clearance: 33.4 mL/min (A) (by C-G formula based on SCr of 1.67 mg/dL (H)).  Liver Function Tests:  Recent Labs Lab 06/25/17 1903  AST 26  ALT 19  ALKPHOS 104  BILITOT 0.3  PROT 7.6  ALBUMIN 3.9    Recent Labs Lab 06/25/17 1903  LIPASE 36   No results for input(s): AMMONIA in the last 168 hours.  Coagulation Profile: No results for input(s): INR, PROTIME in the last 168 hours.  Cardiac Enzymes:  Recent Labs Lab 06/25/17 1903 06/25/17 2241 06/26/17 0616  TROPONINI <0.03 <0.03 <0.03    BNP (last 3 results) No results for input(s): PROBNP in the last 8760 hours.  HbA1C: No results for input(s): HGBA1C in the last 72 hours.  CBG:  Recent Labs Lab 06/25/17 1929  GLUCAP 134*    Lipid Profile: No results for input(s): CHOL, HDL, LDLCALC, TRIG, CHOLHDL, LDLDIRECT in the last 72 hours.  Thyroid Function Tests: No results for input(s): TSH, T4TOTAL, FREET4, T3FREE, THYROIDAB in the last 72 hours.  Anemia Panel: No results for input(s): VITAMINB12, FOLATE, FERRITIN, TIBC, IRON, RETICCTPCT in the last 72 hours.  Urine analysis: No results found for: COLORURINE, APPEARANCEUR, LABSPEC, PHURINE, GLUCOSEU, HGBUR, BILIRUBINUR, KETONESUR, PROTEINUR,  UROBILINOGEN, NITRITE, LEUKOCYTESUR  Sepsis Labs: @LABRCNTIP (procalcitonin:4,lacticidven:4) )No results found for this or any previous visit (from the past 240 hour(s)).   Radiological Exams on Admission: Dg Chest 2 View  Result Date: 06/25/2017 CLINICAL DATA:  Mid chest pain radiating to back with sudden onset yesterday, history hypertension, coronary disease post CABG, stroke, former smoker EXAM: CHEST  2 VIEW COMPARISON:  10/16/2015 FINDINGS: Normal heart size post CABG. Atherosclerotic calcification aorta. Minimal scarring LEFT mid lung. Emphysematous changes without infiltrate, pleural effusion or pneumothorax. No acute bony findings. IMPRESSION: Post CABG. Emphysematous changes and minimal LEFT lung scarring without acute infiltrate. Electronically Signed   By: Lavonia Dana M.D.   On: 06/25/2017 19:27    EKG: Independently reviewed.  Assessment/Plan Active Problems:   Chest pain   Aneurysm of abdominal vessel (HCC)   Age-related osteoporosis without current pathological fracture   Alzheimer's disease   Benign localized hyperplasia of prostate with urinary obstruction   Blind left eye   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)   Chronic kidney disease (CKD), stage III (moderate)   COPD (chronic obstructive pulmonary disease) (HCC)   Coronary artery disease involving coronary bypass graft of native heart with angina pectoris ([place])   Essential hypertension   GERD (gastroesophageal reflux disease)   Pure hypercholesterolemia    Chest pain syndrome/known CAD  cardiac versus GI  HEART score 4 . Troponin neg  , EKG without evidence of ACS. CP somewhat relieved with  aspirin. CXR unrevealing He also received GI cocktail  Admit to Telemetry/ Observation Chest pain order set Cycle troponins EKG in am continue ASA, O2  Prn.  GI cocktail Check Lipid panel  Hb A1C Consult to Cards is pending, likely to eval for stress test  If no  cardiac issues are found, may  benefit from OP evaluation with GI    GERD, no acute symptoms Continue PPI   Hypertension BP 158/51  Pulse   58   Continue home anti-hypertensive medications  Per Cards recommendations, will tritrate Norvasc to 10 mg (was on 5 mg daily)   Hydralazine was ordered by night physician for BP 180/110    Chronic kidney disease stage  3   Current Cr 1.67  Baseline 1.5-1.6 Lab Results  Component Value Date   CREATININE 1.67 (H) 06/25/2017  Repeat CMET in am Avoid NSAIDs  Continue to closely monitor    Hyperlipidemia Continue home statins   Dementia, according to wife this is worsening  Continue Namenda and Aricept  Follow as OP with NEuro  Benign prostatic hypertrophy, no acute issues Continue Flomax and Sodium bicarb      DVT prophylaxis  Heparin Code Status:   Full     Family Communication:  Discussed with patient Disposition Plan: Expect patient to be discharged to home after condition improves Consults called:   Cardiology by EDP  Admission status:Tele   Obs   [facility] E, PA-C [facility]   06/26/2017, 7:52 AM

## 2017-06-27 DIAGNOSIS — I951 Orthostatic hypotension: Secondary | ICD-10-CM | POA: Diagnosis not present

## 2017-06-27 DIAGNOSIS — K219 Gastro-esophageal reflux disease without esophagitis: Secondary | ICD-10-CM

## 2017-06-27 DIAGNOSIS — N183 Chronic kidney disease, stage 3 (moderate): Secondary | ICD-10-CM | POA: Diagnosis not present

## 2017-06-27 DIAGNOSIS — R072 Precordial pain: Secondary | ICD-10-CM | POA: Diagnosis not present

## 2017-06-27 DIAGNOSIS — R079 Chest pain, unspecified: Secondary | ICD-10-CM | POA: Diagnosis not present

## 2017-06-27 DIAGNOSIS — N401 Enlarged prostate with lower urinary tract symptoms: Secondary | ICD-10-CM | POA: Diagnosis not present

## 2017-06-27 DIAGNOSIS — R001 Bradycardia, unspecified: Secondary | ICD-10-CM

## 2017-06-27 DIAGNOSIS — I2510 Atherosclerotic heart disease of native coronary artery without angina pectoris: Secondary | ICD-10-CM | POA: Diagnosis not present

## 2017-06-27 DIAGNOSIS — I25118 Atherosclerotic heart disease of native coronary artery with other forms of angina pectoris: Secondary | ICD-10-CM | POA: Diagnosis not present

## 2017-06-27 DIAGNOSIS — I10 Essential (primary) hypertension: Secondary | ICD-10-CM | POA: Diagnosis not present

## 2017-06-27 DIAGNOSIS — N138 Other obstructive and reflux uropathy: Secondary | ICD-10-CM

## 2017-06-27 DIAGNOSIS — Z7982 Long term (current) use of aspirin: Secondary | ICD-10-CM | POA: Diagnosis not present

## 2017-06-27 LAB — COMPREHENSIVE METABOLIC PANEL
ALK PHOS: 83 U/L (ref 38–126)
ALT: 17 U/L (ref 17–63)
AST: 27 U/L (ref 15–41)
Albumin: 3.1 g/dL — ABNORMAL LOW (ref 3.5–5.0)
Anion gap: 9 (ref 5–15)
BUN: 18 mg/dL (ref 6–20)
CALCIUM: 8.6 mg/dL — AB (ref 8.9–10.3)
CHLORIDE: 107 mmol/L (ref 101–111)
CO2: 24 mmol/L (ref 22–32)
CREATININE: 1.64 mg/dL — AB (ref 0.61–1.24)
GFR, EST AFRICAN AMERICAN: 45 mL/min — AB (ref >60)
GFR, EST NON AFRICAN AMERICAN: 39 mL/min — AB (ref >60)
Glucose, Bld: 102 mg/dL — ABNORMAL HIGH (ref 65–99)
Potassium: 4 mmol/L (ref 3.5–5.1)
Sodium: 140 mmol/L (ref 135–145)
Total Bilirubin: 0.7 mg/dL (ref 0.3–1.2)
Total Protein: 6.3 g/dL — ABNORMAL LOW (ref 6.5–8.1)

## 2017-06-27 LAB — CBC
HCT: 42.6 % (ref 39.0–52.0)
HEMOGLOBIN: 13.7 g/dL (ref 13.0–17.0)
MCH: 29.2 pg (ref 26.0–34.0)
MCHC: 32.2 g/dL (ref 30.0–36.0)
MCV: 90.8 fL (ref 78.0–100.0)
PLATELETS: 253 10*3/uL (ref 150–400)
RBC: 4.69 MIL/uL (ref 4.22–5.81)
RDW: 14.3 % (ref 11.5–15.5)
WBC: 8.2 10*3/uL (ref 4.0–10.5)

## 2017-06-27 MED ORDER — OMEPRAZOLE 40 MG PO CPDR: 40.0000 mg | DELAYED_RELEASE_CAPSULE | Freq: Every day | ORAL | 0 refills | Status: DC

## 2017-06-27 MED ORDER — MEMANTINE HCL ER 28 MG PO CP24: 28.0000 mg | ORAL_CAPSULE | Freq: Every day | ORAL | Status: DC

## 2017-06-27 MED ORDER — AMLODIPINE BESYLATE 5 MG PO TABS: 5.0000 mg | ORAL_TABLET | Freq: Every day | ORAL | 0 refills | Status: DC

## 2017-06-27 MED ORDER — SODIUM BICARBONATE 650 MG PO TABS: 650.0000 mg | ORAL_TABLET | Freq: Every day | ORAL | Status: DC

## 2017-06-27 NOTE — Discharge Instructions (Signed)
Follow with Primary MD  Bernerd Limbo, MD  and other consultant's as instructed your Hospitalist MD  Please get a complete blood count and chemistry panel checked by your Primary MD at your next visit, and again as instructed by your Primary MD.  Get Medicines reviewed and adjusted: Please take all your medications with you for your next visit with your Primary MD  Laboratory/radiological data: Please request your Primary MD to go over all hospital tests and procedure/radiological results at the follow up, please ask your Primary MD to get all Hospital records sent to his/her office.  In some cases, they will be blood work, cultures and biopsy results pending at the time of your discharge. Please request that your primary care M.D. follows up on these results.  Also Note the following: If you experience worsening of your admission symptoms, develop shortness of breath, life threatening emergency, suicidal or homicidal thoughts you must seek medical attention immediately by calling 911 or calling your MD immediately  if symptoms less severe.  You must read complete instructions/literature along with all the possible adverse reactions/side effects for all the Medicines you take and that have been prescribed to you. Take any new Medicines after you have completely understood and accpet all the possible adverse reactions/side effects.   Do not drive when taking Pain medications or sleeping medications (Benzodaizepines)  Do not take more than prescribed Pain, Sleep and Anxiety Medications. It is not advisable to combine anxiety,sleep and pain medications without talking with your primary care practitioner  Special Instructions: If you have smoked or chewed Tobacco  in the last 2 yrs please stop smoking, stop any regular Alcohol  and or any Recreational drug use.  Wear Seat belts while driving.  Please note: You were cared for by a hospitalist during your hospital stay. Once you are discharged,  your primary care physician will handle any further medical issues. Please note that NO REFILLS for any discharge medications will be authorized once you are discharged, as it is imperative that you return to your primary care physician (or establish a relationship with a primary care physician if you do not have one) for your post hospital discharge needs so that they can reassess your need for medications and monitor your lab values.

## 2017-06-27 NOTE — Progress Notes (Signed)
Progress Note  Patient Name: Juan Montoya Date of Encounter: 06/27/2017  Primary Cardiologist: Dolan Amen [facility])  Subjective   No further chest pain overnight, denies angina or other cardiac complaints. Sinus bradycardia in the 50s at rest. Not had problems with low blood pressure after receiving amlodipine 10 mg yesterday. However, diastolic blood pressure is fairly low around 60.  Inpatient Medications    Scheduled Meds: . amLODipine  10 mg Oral Daily  . aspirin EC  81 mg Oral QODAY  . atorvastatin  80 mg Oral Daily  . darifenacin  7.5 mg Oral QPC supper  . donepezil  10 mg Oral q morning - 10a  . febuxostat  40 mg Oral Daily  . heparin  5,000 Units Subcutaneous Q8H  . memantine  28 mg Oral Daily  . metoprolol succinate  12.5 mg Oral BID  . pantoprazole  40 mg Oral Daily  . regadenoson  0.4 mg Intravenous Once  . sodium bicarbonate  650 mg Oral Daily  . tamsulosin  0.4 mg Oral Daily   Continuous Infusions: . sodium chloride 75 mL/hr at 06/27/17 0158   PRN Meds: ALPRAZolam, gi cocktail, hydrALAZINE, ondansetron (ZOFRAN) IV   Vital Signs    Vitals:   06/26/17 1327 06/26/17 2111 06/26/17 2216 06/27/17 0600  BP: (!) 184/65 (!) 174/64 120/63 (!) 164/59  Pulse:  (!) 54 64 60  Resp: 18 19 18 15   Temp: 97.7 F (36.5 C) 98.4 F (36.9 C)  98.6 F (37 C)  TempSrc: Oral     SpO2: 94% 94% 96% 92%  Weight:    149 lb 11.2 oz (67.9 kg)  Height:        Intake/Output Summary (Last 24 hours) at 06/27/17 1027 Last data filed at 06/27/17 0500  Gross per 24 hour  Intake          1303.75 ml  Output             1050 ml  Net           253.75 ml   Filed Weights   06/26/17 0356 06/27/17 0600  Weight: 148 lb 9.6 oz (67.4 kg) 149 lb 11.2 oz (67.9 kg)    Telemetry    Sinus bradycardia with PACs - Personally Reviewed  ECG    Sinus bradycardia, no repolarization abnormalities - Personally Reviewed  Physical Exam  Calm, comfortable GEN: No acute  distress.   Neck: No JVD Cardiac: RRR, no murmurs, rubs, or gallops.  Respiratory: Clear to auscultation bilaterally. GI: Soft, nontender, non-distended  MS: No edema; No deformity. Neuro:  Nonfocal  Psych: Normal affect   Labs    Chemistry Recent Labs Lab 06/25/17 1903 06/27/17 0209  NA 140 140  K 4.0 4.0  CL 103 107  CO2 28 24  GLUCOSE 124* 102*  BUN 20 18  CREATININE 1.67* 1.64*  CALCIUM 9.1 8.6*  PROT 7.6 6.3*  ALBUMIN 3.9 3.1*  AST 26 27  ALT 19 17  ALKPHOS 104 83  BILITOT 0.3 0.7  GFRNONAA 38* 39*  GFRAA 44* 45*  ANIONGAP 9 9     Hematology Recent Labs Lab 06/25/17 1903 06/27/17 0209  WBC 8.4 8.2  RBC 5.36 4.69  HGB 15.8 13.7  HCT 48.2 42.6  MCV 89.9 90.8  MCH 29.5 29.2  MCHC 32.8 32.2  RDW 14.3 14.3  PLT 266 253    Cardiac Enzymes Recent Labs Lab 06/26/17 0658 06/26/17 1408 06/26/17 1812 06/26/17 2059  TROPONINI <  0.03 <0.03 <0.03 <0.03   No results for input(s): TROPIPOC in the last 168 hours.   BNPNo results for input(s): BNP, PROBNP in the last 168 hours.   DDimer No results for input(s): DDIMER in the last 168 hours.   Radiology    Dg Chest 2 View  Result Date: 06/25/2017 CLINICAL DATA:  Mid chest pain radiating to back with sudden onset yesterday, history hypertension, coronary disease post CABG, stroke, former smoker EXAM: CHEST  2 VIEW COMPARISON:  10/16/2015 FINDINGS: Normal heart size post CABG. Atherosclerotic calcification aorta. Minimal scarring LEFT mid lung. Emphysematous changes without infiltrate, pleural effusion or pneumothorax. No acute bony findings. IMPRESSION: Post CABG. Emphysematous changes and minimal LEFT lung scarring without acute infiltrate. Electronically Signed   By: Lavonia Dana M.D.   On: 06/25/2017 19:27   Nm Myocar Multi W/spect W/wall Motion / Ef  Result Date: 06/26/2017 CLINICAL DATA:  Fifth chest pain. Previous CABG. Hypertension, COPD. EXAM: MYOCARDIAL IMAGING WITH SPECT (REST AND  PHARMACOLOGIC-STRESS) GATED LEFT VENTRICULAR WALL MOTION STUDY LEFT VENTRICULAR EJECTION FRACTION TECHNIQUE: Standard myocardial SPECT imaging was performed after resting intravenous injection of 10 mCi Tc-39m tetrofosmin. Subsequently, intravenous infusion of Lexiscan was performed under the supervision of the Cardiology staff. At peak effect of the drug, 30 mCi Tc-68m tetrofosmin was injected intravenously and standard myocardial SPECT imaging was performed. Quantitative gated imaging was also performed to evaluate left ventricular wall motion, and estimate left ventricular ejection fraction. COMPARISON:  None. FINDINGS: Perfusion: No decreased activity in the left ventricle on stress imaging to suggest reversible ischemia. Small Fixed anteroapical defect. Wall Motion: Decreased apical thickening. Otherwise Normal left ventricular wall motion. No left ventricular dilation. Left Ventricular Ejection Fraction: 67 % End diastolic volume 58 ml End systolic volume 19 ml IMPRESSION: 1. No reversible ischemia.  Fixed anteroapical perfusion defect. 2. Normal left ventricular wall motion. 3. Left ventricular ejection fraction 67% 4. Non invasive risk stratification*: Low *2012 Appropriate Use Criteria for Coronary Revascularization Focused Update: J Am Coll Cardiol. 9381;82(9):937-169. http://content.airportbarriers.com.aspx?articleid=[email or web address] Electronically Signed   By: Lucrezia Europe M.D.   On: 06/26/2017 14:33    Cardiac Studies  Echo 06/26/2017 - Procedure narrative: Transthoracic echocardiography. Image   quality was poor. The study was technically difficult, as a   result of poor acoustic windows and poor sound wave transmission. - Left ventricle: The cavity size was normal. Wall thickness was   increased in a pattern of mild LVH. Systolic function was normal.   Wall motion was normal; there were no regional wall motion   abnormalities. Doppler parameters are consistent with abnormal   left ventricular  relaxation (grade 1 diastolic dysfunction).   Doppler parameters are consistent with indeterminate ventricular   filling pressure. - Aortic valve: Transvalvular velocity was within the normal range.   There was no stenosis. There was no regurgitation. - Mitral valve: Transvalvular velocity was within the normal range.   There was no evidence for stenosis. There was no regurgitation. - Right ventricle: The cavity size was normal. Wall thickness was   normal. Systolic function was normal. - Atrial septum: No defect or patent foramen ovale was identified   by color flow Doppler. - Tricuspid valve: There was no regurgitation.  I have personally reviewed the echo. I do not think there are any torn chordae tendinae. There are redundant chordae, hyper mobile in the left ventricle. There is no evidence of mitral insufficiency.  Patient Profile     76 y.o. male with poorly described chest discomfort  with some features suggesting angina and another suggesting possible gastroesophageal reflux, known CAD, systemic hypertension with symptomatic orthostatic hypotension, AAA.  Assessment & Plan    The nuclear stress test shows low risk findings. There is no evidence of any meaningful area of reversible ischemia. Normal left ventricular systolic function by both quantitative scintigraphy and echo. Amlodipine has been increased to 10 mg. Watch carefully for development of orthostatic hypotension. If this develops, will decrease back to 5 mg daily. Beta blockers cannot be increased due to orthostatic hypotension and bradycardia. Bradycardia is probably also related to the use of cholinesterase inhibitors, but these medications are necessary for his cognitive deficit. He was intolerant of nitrates due to severe headaches associated with nausea and vomiting, every time he has tried taking them in the past. He has been prescribed Ranexa before but this has been discontinued. Neither patient, nor his wife can  not remember why it was stopped. They do not recall side effects. If chest pain problems continue, I think Ranexa 500 mg twice daily would be an excellent choice since we will have no impact on heart rate or blood pressure. In parallel, recommend increasing the dose of omeprazole to 40 mg daily, since some of his symptoms may be acid reflux related. Follow-up with Dr. Quillian Quince in [facility]. Okay for discharge from my point of view.  Signed, Sanda Klein, MD  06/27/2017, 10:27 AM

## 2017-06-27 NOTE — Care Management Obs Status (Signed)
[facility] NOTIFICATION   Patient Details  Name: CLARICE BONAVENTURE MRN: [id number] Date of Birth: 08-19-1941   Medicare Observation Status Notification Given:  Yes    Bethena Roys, RN 06/27/2017, 11:22 AM

## 2017-06-27 NOTE — Discharge Summary (Signed)
Physician Discharge Summary  CAROLS CLEMENCE OIZ:[id number] DOB: 09-30-1941 DOA: 06/25/2017  PCP: Bernerd Limbo, MD Cardiologist: Dr. Garen Lah  Admit date: 06/25/2017 Discharge date: 06/27/2017  Admitted From: Home  Disposition: Home   Recommendations for Outpatient Follow-up:  1. Follow up with PCP in 1 weeks 2. Follow up with cardiology in 2 weeks 3. Please obtain BMP/CBC in one week  Discharge Condition: STABLE   CODE STATUS: FULL    Brief Hospitalization Summary: Please see all hospital notes, images, labs for full details of the hospitalization.  HPI: Juan Montoya is a 76 y.o. male with medical history significant for Alzheimer's dementia, TIA, Carotid artery stenosis, HTN, HLD, CKD, AAA followed at [facility] Dr Wyvonnia Dusky s/p CABG presented to [facility] with persistent, 2 day history of  chest pain, radiating to the upper chest and to the interscapular area, non exertional.  History is obtained by his wife, a former nurse. Per her report, he is unsure if symptoms are similar to those prior to CABG.  Pain notworsened with deep inspiration, movement or exertion.  Denies any dizziness or falls. No syncope or presyncope.Last visit to his Cardiologist on 01/2017. A Holter monitor was placed at the time for palpitations with no acute findings .Last stress test 07/2016. No shortness of breath or cough. Denies any fever or chills. Denies any nausea, vomiting or abdominal pain. Appetite is normal. Denies any leg swelling or calf pain. Denies any headaches or vision changes. Denies any seizures No recent long distance trips.  No new meds. Not on hormonal therapy.  No new herbal supplements. He is compliant with his meds  No bleeding issues. No tobacco, ETOH or recreational drugs.  ED Course:  BP (!) 158/51 (BP Location: Left Arm)   Pulse (!) 58   Temp 97.8 F (36.6 C) (Oral)   Resp 14   Ht 5\' 6"  (1.676 m)   Wt 67.4 kg (148 lb 9.6 oz)   SpO2 96%   BMI 23.98 kg/m   Given ASA 324 mg, GI cocktail  with some relief  glu 124 Cr 1.67 w GFR 30s  Tn neg  CXR NAD EKG Sinus rhythm t wave flattening in I and aVL Otherwise no significant change   Chest pain syndrome/known CAD  cardiac versus GI  HEART score 4 . Troponin neg  , EKG without evidence of ACS. CP somewhat relieved with GI cocktail From cardiology consulting team:  The nuclear stress test shows low risk findings. There is no evidence of any meaningful area of reversible ischemia. Normal left ventricular systolic function by both quantitative scintigraphy and echo. Amlodipine has been increased to 10 mg. Watch carefully for development of orthostatic hypotension. If this develops, will decrease back to 5 mg daily. Beta blockers cannot be increased due to orthostatic hypotension and bradycardia. Bradycardia is probably also related to the use of cholinesterase inhibitors, but these medications are necessary for his cognitive deficit. He was intolerant of nitrates due to severe headaches associated with nausea and vomiting, every time he has tried taking them in the past. He has been prescribed Ranexa before but this has been discontinued. Neither patient, nor his wife can not remember why it was stopped. They do not recall side effects. If chest pain problems continue, I think Ranexa 500 mg twice daily would be an excellent choice since we will have no impact on heart rate or blood pressure. In parallel, recommend increasing the dose of omeprazole to 40 mg daily, since some of his  symptoms may be acid reflux related. Follow-up with Dr. Quillian Quince in [facility]. Okay for discharge from my point of view.  GERD, no acute symptoms Continue PPI   Hypertension BP 158/51  Pulse   58   Continue home anti-hypertensive medications  Per Cards recommendations, will tritrate Norvasc to 10 mg (was on 5 mg daily)   Hydralazine was ordered by night physician for BP 180/110    Chronic kidney disease stage  3   Current Cr 1.67  Baseline  1.5-1.6 Recent Labs       Lab Results  Component Value Date   CREATININE 1.67 (H) 06/25/2017    Avoid NSAIDs  Continue to closely monitor  Hyperlipidemia Continue home statins  Dementia, according to wife this is worsening  Continue Namenda and Aricept  Follow as OP with [facility]  Benign prostatic hypertrophy, no acute issues Continue Flomax and Sodium bicarb   DVT prophylaxis  Heparin Code Status:   Full     Family Communication:  Discussed with patient Disposition Plan: HOME  Consults called:   Cardiology by EDP    Discharge Diagnoses:  Active Problems:   Aneurysm of abdominal vessel (HCC)   Chest pain   Age-related osteoporosis without current pathological fracture   Alzheimer's disease   Benign localized hyperplasia of prostate with urinary obstruction   Blind left eye   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)   Chronic kidney disease (CKD), stage III (moderate)   COPD (chronic obstructive pulmonary disease) (HCC)   Coronary artery disease involving coronary bypass graft of native heart with angina pectoris (HCC)   Essential hypertension   GERD (gastroesophageal reflux disease)   Pure hypercholesterolemia  Discharge Instructions: Discharge Instructions    Call MD for:  difficulty breathing, headache or visual disturbances    Complete by:  As directed    Call MD for:  extreme fatigue    Complete by:  As directed    Call MD for:  hives    Complete by:  As directed    Call MD for:  persistant dizziness or light-headedness    Complete by:  As directed    Call MD for:  persistant nausea and vomiting    Complete by:  As directed    Call MD for:  severe uncontrolled pain    Complete by:  As directed    Increase activity slowly    Complete by:  As directed      Allergies as of 06/27/2017      Reactions   Ace Inhibitors Other (See Comments)   hyponatremia hyponatremia   Codeine Itching   nausea   Hctz  [hydrochlorothiazide] Other (See Comments)   hyponatremia hyponatremia      Medication List    TAKE these medications   amLODipine 5 MG tablet Commonly known as:  NORVASC Take 1 tablet (5 mg total) by mouth daily. What changed:  how much to take   aspirin EC 81 MG tablet Take 81 mg by mouth every other day.   atorvastatin 80 MG tablet Commonly known as:  LIPITOR Take 80 mg by mouth daily.   donepezil 10 MG tablet Commonly known as:  ARICEPT Take 10 mg by mouth every morning.   febuxostat 40 MG tablet Commonly known as:  ULORIC Take 40 mg by mouth daily.   memantine 28 MG Cp24 24 hr capsule Commonly known as:  NAMENDA XR Take 1 capsule (28 mg total) by mouth daily.  metoprolol succinate 25 MG 24 hr tablet Commonly known as:  TOPROL-XL Take 12.5 mg by mouth daily.   omeprazole 40 MG capsule Commonly known as:  PRILOSEC Take 1 capsule (40 mg total) by mouth daily. What changed:  medication strength  how much to take   PROBIOTIC & ACIDOPHILUS EX ST PO Take 1 tablet by mouth daily.   sodium bicarbonate 650 MG tablet Take 1 tablet (650 mg total) by mouth daily.   solifenacin 10 MG tablet Commonly known as:  VESICARE Take 10 mg by mouth at bedtime.   tamsulosin 0.4 MG Caps capsule Commonly known as:  FLOMAX Take 0.4 mg by mouth daily.            Discharge Care Instructions        Start     Ordered   06/27/17 0000  sodium bicarbonate 650 MG tablet  Daily     06/27/17 1059   06/27/17 0000  memantine (NAMENDA XR) 28 MG CP24 24 hr capsule  Daily     06/27/17 1059   06/27/17 0000  omeprazole (PRILOSEC) 40 MG capsule  Daily     06/27/17 1059   06/27/17 0000  Increase activity slowly     06/27/17 1059   06/27/17 0000  Call MD for:  persistant nausea and vomiting     06/27/17 1059   06/27/17 0000  Call MD for:  severe uncontrolled pain     06/27/17 1059   06/27/17 0000  Call MD for:  difficulty breathing, headache or visual disturbances     06/27/17  1059   06/27/17 0000  Call MD for:  persistant dizziness or light-headedness     06/27/17 1059   06/27/17 0000  Call MD for:  hives     06/27/17 1059   06/27/17 0000  Call MD for:  extreme fatigue     06/27/17 1059   06/27/17 0000  amLODipine (NORVASC) 5 MG tablet  Daily     06/27/17 1104     Follow-up Information    Bishop Limbo R., DO. Schedule an appointment as soon as possible for a visit in 2 week(s).   Specialty:  Cardiology Contact information: [address] [place] [address] [phone]        Bernerd Limbo, MD. Schedule an appointment as soon as possible for a visit in 1 week(s).   Specialty:  Family Medicine Contact information: [address] [place] [address] [phone]          Allergies  Allergen Reactions  . Ace Inhibitors Other (See Comments)    hyponatremia hyponatremia  . Codeine Itching    nausea  . Hctz [Hydrochlorothiazide] Other (See Comments)    hyponatremia hyponatremia   Current Discharge Medication List    CONTINUE these medications which have CHANGED   Details  amLODipine (NORVASC) 5 MG tablet Take 1 tablet (5 mg total) by mouth daily. Qty: 30 tablet, Refills: 0    memantine (NAMENDA XR) 28 MG CP24 24 hr capsule Take 1 capsule (28 mg total) by mouth daily. Qty: 30 capsule    omeprazole (PRILOSEC) 40 MG capsule Take 1 capsule (40 mg total) by mouth daily. Qty: 30 capsule, Refills: 0    sodium bicarbonate 650 MG tablet Take 1 tablet (650 mg total) by mouth daily.      CONTINUE these medications which have NOT CHANGED   Details  aspirin EC 81 MG tablet Take 81 mg by mouth every other day.  atorvastatin (LIPITOR) 80 MG tablet Take 80 mg by mouth daily.    donepezil (ARICEPT) 10 MG tablet Take 10 mg by mouth every morning.     febuxostat (ULORIC) 40 MG tablet Take 40 mg by mouth daily.     Probiotic Product (PROBIOTIC & ACIDOPHILUS EX ST PO) Take 1 tablet by mouth daily.     solifenacin  (VESICARE) 10 MG tablet Take 10 mg by mouth at bedtime.     Tamsulosin HCl (FLOMAX) 0.4 MG CAPS Take 0.4 mg by mouth daily.    metoprolol succinate (TOPROL-XL) 25 MG 24 hr tablet Take 12.5 mg by mouth daily.         Procedures/Studies: Dg Chest 2 View  Result Date: 06/25/2017 CLINICAL DATA:  Mid chest pain radiating to back with sudden onset yesterday, history hypertension, coronary disease post CABG, stroke, former smoker EXAM: CHEST  2 VIEW COMPARISON:  10/16/2015 FINDINGS: Normal heart size post CABG. Atherosclerotic calcification aorta. Minimal scarring LEFT mid lung. Emphysematous changes without infiltrate, pleural effusion or pneumothorax. No acute bony findings. IMPRESSION: Post CABG. Emphysematous changes and minimal LEFT lung scarring without acute infiltrate. Electronically Signed   By: Lavonia Dana M.D.   On: 06/25/2017 19:27   Nm Myocar Multi W/spect W/wall Motion / Ef  Result Date: 06/26/2017 CLINICAL DATA:  Fifth chest pain. Previous CABG. Hypertension, COPD. EXAM: MYOCARDIAL IMAGING WITH SPECT (REST AND PHARMACOLOGIC-STRESS) GATED LEFT VENTRICULAR WALL MOTION STUDY LEFT VENTRICULAR EJECTION FRACTION TECHNIQUE: Standard myocardial SPECT imaging was performed after resting intravenous injection of 10 mCi Tc-66m tetrofosmin. Subsequently, intravenous infusion of Lexiscan was performed under the supervision of the Cardiology staff. At peak effect of the drug, 30 mCi Tc-5m tetrofosmin was injected intravenously and standard myocardial SPECT imaging was performed. Quantitative gated imaging was also performed to evaluate left ventricular wall motion, and estimate left ventricular ejection fraction. COMPARISON:  None. FINDINGS: Perfusion: No decreased activity in the left ventricle on stress imaging to suggest reversible ischemia. Small Fixed anteroapical defect. Wall Motion: Decreased apical thickening. Otherwise Normal left ventricular wall motion. No left ventricular dilation. Left  Ventricular Ejection Fraction: 67 % End diastolic volume 58 ml End systolic volume 19 ml IMPRESSION: 1. No reversible ischemia.  Fixed anteroapical perfusion defect. 2. Normal left ventricular wall motion. 3. Left ventricular ejection fraction 67% 4. Non invasive risk stratification*: Low *2012 Appropriate Use Criteria for Coronary Revascularization Focused Update: J Am Coll Cardiol. 3419;62(2):297-989. http://content.airportbarriers.com.aspx?articleid=[email or web address] Electronically Signed   By: Lucrezia Europe M.D.   On: 06/26/2017 14:33     Subjective: Pt says he feels much better, no chest pain, no SOB. Wants to go home.   Discharge Exam: Vitals:   06/26/17 2216 06/27/17 0600  BP: 120/63 (!) 164/59  Pulse: 64 60  Resp: 18 15  Temp:  98.6 F (37 C)  SpO2: 96% 92%   Vitals:   06/26/17 1327 06/26/17 2111 06/26/17 2216 06/27/17 0600  BP: (!) 184/65 (!) 174/64 120/63 (!) 164/59  Pulse:  (!) 54 64 60  Resp: 18 19 18 15   Temp: 97.7 F (36.5 C) 98.4 F (36.9 C)  98.6 F (37 C)  TempSrc: Oral     SpO2: 94% 94% 96% 92%  Weight:    67.9 kg (149 lb 11.2 oz)  Height:       General: Pt is alert, awake, not in acute distress Cardiovascular: S1/S2 +, no rubs, no gallops Respiratory: CTA bilaterally, no wheezing, no rhonchi Abdominal: Soft, NT, ND, bowel sounds + Extremities: no edema,  no cyanosis   The results of significant diagnostics from this hospitalization (including imaging, microbiology, ancillary and laboratory) are listed below for reference.    Microbiology: No results found for this or any previous visit (from the past 240 hour(s)).   Labs: BNP (last 3 results) No results for input(s): BNP in the last 8760 hours. Basic Metabolic Panel:  Recent Labs Lab 06/25/17 1903 06/27/17 0209  NA 140 140  K 4.0 4.0  CL 103 107  CO2 28 24  GLUCOSE 124* 102*  BUN 20 18  CREATININE 1.67* 1.64*  CALCIUM 9.1 8.6*   Liver Function Tests:  Recent Labs Lab 06/25/17 1903 06/27/17 0209   AST 26 27  ALT 19 17  ALKPHOS 104 83  BILITOT 0.3 0.7  PROT 7.6 6.3*  ALBUMIN 3.9 3.1*    Recent Labs Lab 06/25/17 1903  LIPASE 36   No results for input(s): AMMONIA in the last 168 hours. CBC:  Recent Labs Lab 06/25/17 1903 06/27/17 0209  WBC 8.4 8.2  HGB 15.8 13.7  HCT 48.2 42.6  MCV 89.9 90.8  PLT 266 253   Cardiac Enzymes:  Recent Labs Lab 06/26/17 0616 06/26/17 0658 06/26/17 1408 06/26/17 1812 06/26/17 2059  TROPONINI <0.03 <0.03 <0.03 <0.03 <0.03   BNP: Invalid input(s): POCBNP CBG:  Recent Labs Lab 06/25/17 1929  GLUCAP 134*   D-Dimer No results for input(s): DDIMER in the last 72 hours. Hgb A1c  Recent Labs  06/26/17 0658  HGBA1C 6.4*   Lipid Profile  Recent Labs  06/26/17 0658  CHOL 143  HDL 28*  LDLCALC 61  TRIG 271*  CHOLHDL 5.1   Thyroid function studies No results for input(s): TSH, T4TOTAL, T3FREE, THYROIDAB in the last 72 hours.  Invalid input(s): FREET3 Anemia work up No results for input(s): VITAMINB12, FOLATE, FERRITIN, TIBC, IRON, RETICCTPCT in the last 72 hours. Urinalysis No results found for: COLORURINE, APPEARANCEUR, LABSPEC, [place], GLUCOSEU, HGBUR, BILIRUBINUR, KETONESUR, PROTEINUR, UROBILINOGEN, NITRITE, LEUKOCYTESUR Sepsis Labs Invalid input(s): PROCALCITONIN,  WBC,  LACTICIDVEN Microbiology No results found for this or any previous visit (from the past 240 hour(s)).  Time coordinating discharge:   SIGNED:  Irwin Brakeman, MD  [facility] 06/27/2017, 11:04 AM Pager [phone]  If 7PM-7AM, please contact night-coverage [email or web address] Password [email or web address]

## 2017-07-01 DIAGNOSIS — F17201 Nicotine dependence, unspecified, in remission: Secondary | ICD-10-CM | POA: Insufficient documentation

## 2017-07-08 ENCOUNTER — Encounter (HOSPITAL_BASED_OUTPATIENT_CLINIC_OR_DEPARTMENT_OTHER): Payer: Self-pay | Admitting: Emergency Medicine

## 2017-07-08 ENCOUNTER — Emergency Department (HOSPITAL_BASED_OUTPATIENT_CLINIC_OR_DEPARTMENT_OTHER)
Admission: EM | Admit: 2017-07-08 | Discharge: 2017-07-08 | Disposition: A | Discharge status: Home / Self Care | Payer: Medicare Other | Attending: Emergency Medicine | Admitting: Emergency Medicine

## 2017-07-08 ENCOUNTER — Emergency Department (HOSPITAL_BASED_OUTPATIENT_CLINIC_OR_DEPARTMENT_OTHER): Payer: Medicare Other

## 2017-07-08 DIAGNOSIS — I129 Hypertensive chronic kidney disease with stage 1 through stage 4 chronic kidney disease, or unspecified chronic kidney disease: Secondary | ICD-10-CM | POA: Diagnosis not present

## 2017-07-08 DIAGNOSIS — Z951 Presence of aortocoronary bypass graft: Secondary | ICD-10-CM | POA: Insufficient documentation

## 2017-07-08 DIAGNOSIS — I2510 Atherosclerotic heart disease of native coronary artery without angina pectoris: Secondary | ICD-10-CM | POA: Diagnosis not present

## 2017-07-08 DIAGNOSIS — R0789 Other chest pain: Secondary | ICD-10-CM

## 2017-07-08 DIAGNOSIS — Z87891 Personal history of nicotine dependence: Secondary | ICD-10-CM | POA: Insufficient documentation

## 2017-07-08 DIAGNOSIS — Z79899 Other long term (current) drug therapy: Secondary | ICD-10-CM | POA: Diagnosis not present

## 2017-07-08 DIAGNOSIS — J449 Chronic obstructive pulmonary disease, unspecified: Secondary | ICD-10-CM | POA: Insufficient documentation

## 2017-07-08 DIAGNOSIS — R079 Chest pain, unspecified: Secondary | ICD-10-CM | POA: Diagnosis present

## 2017-07-08 DIAGNOSIS — Z7982 Long term (current) use of aspirin: Secondary | ICD-10-CM | POA: Diagnosis not present

## 2017-07-08 DIAGNOSIS — N183 Chronic kidney disease, stage 3 (moderate): Secondary | ICD-10-CM | POA: Diagnosis not present

## 2017-07-08 LAB — COMPREHENSIVE METABOLIC PANEL
ALK PHOS: 99 U/L (ref 38–126)
ALT: 30 U/L (ref 17–63)
AST: 36 U/L (ref 15–41)
Albumin: 3.6 g/dL (ref 3.5–5.0)
Anion gap: 10 (ref 5–15)
BILIRUBIN TOTAL: 0.7 mg/dL (ref 0.3–1.2)
BUN: 20 mg/dL (ref 6–20)
CALCIUM: 8.7 mg/dL — AB (ref 8.9–10.3)
CO2: 26 mmol/L (ref 22–32)
CREATININE: 1.8 mg/dL — AB (ref 0.61–1.24)
Chloride: 104 mmol/L (ref 101–111)
GFR, EST AFRICAN AMERICAN: 40 mL/min — AB (ref >60)
GFR, EST NON AFRICAN AMERICAN: 35 mL/min — AB (ref >60)
Glucose, Bld: 138 mg/dL — ABNORMAL HIGH (ref 65–99)
Potassium: 3.9 mmol/L (ref 3.5–5.1)
SODIUM: 140 mmol/L (ref 135–145)
TOTAL PROTEIN: 7.5 g/dL (ref 6.5–8.1)

## 2017-07-08 LAB — CBC WITH DIFFERENTIAL/PLATELET
Basophils Absolute: 0 10*3/uL (ref 0.0–0.1)
Basophils Relative: 0 %
EOS ABS: 1.5 10*3/uL — AB (ref 0.0–0.7)
Eosinophils Relative: 15 %
HEMATOCRIT: 44.8 % (ref 39.0–52.0)
HEMOGLOBIN: 14.7 g/dL (ref 13.0–17.0)
LYMPHS ABS: 2.2 10*3/uL (ref 0.7–4.0)
Lymphocytes Relative: 22 %
MCH: 29.6 pg (ref 26.0–34.0)
MCHC: 32.8 g/dL (ref 30.0–36.0)
MCV: 90.3 fL (ref 78.0–100.0)
Monocytes Absolute: 0.6 10*3/uL (ref 0.1–1.0)
Monocytes Relative: 6 %
Neutro Abs: 5.6 10*3/uL (ref 1.7–7.7)
Neutrophils Relative %: 57 %
Platelets: 313 10*3/uL (ref 150–400)
RBC: 4.96 MIL/uL (ref 4.22–5.81)
RDW: 14.5 % (ref 11.5–15.5)
WBC: 10 10*3/uL (ref 4.0–10.5)

## 2017-07-08 LAB — TROPONIN I

## 2017-07-08 MED ORDER — GI COCKTAIL ~~LOC~~
30.0000 mL | Freq: Once | ORAL | Status: AC
  Administered 2017-07-08: 30 mL via ORAL
  Filled 2017-07-08: qty 30

## 2017-07-08 MED ORDER — NITROGLYCERIN 2 % TD OINT
0.5000 [in_us] | TOPICAL_OINTMENT | Freq: Once | TRANSDERMAL | Status: AC
  Administered 2017-07-08: 0.5 [in_us] via TOPICAL
  Filled 2017-07-08: qty 1

## 2017-07-08 MED ORDER — FAMOTIDINE IN NACL 20-0.9 MG/50ML-% IV SOLN
20.0000 mg | Freq: Once | INTRAVENOUS | Status: AC
  Administered 2017-07-08: 20 mg via INTRAVENOUS
  Filled 2017-07-08: qty 50

## 2017-07-08 NOTE — ED Provider Notes (Signed)
[place] DEPT [facility] Provider Note   CSN: [id number] Arrival date & time: 07/08/17  1915     History   Chief Complaint Chief Complaint  Patient presents with  . Chest Pain    HPI Juan Montoya is a 76 y.o. male.  The history is provided by the patient.  Chest Pain   This is a recurrent problem. The current episode started 6 to 12 hours ago. The problem occurs constantly. The problem has not changed since onset.The pain is present in the substernal region. The pain is moderate. The quality of the pain is described as pressure-like and sharp. The pain does not radiate. Pertinent negatives include no abdominal pain, no cough, no diaphoresis, no exertional chest pressure, no leg pain, no nausea and no shortness of breath. He has tried antacids for the symptoms.  His past medical history is significant for aortic aneurysm (4.2 on [place] in June), CAD (s/p cabg), hyperlipidemia, hypertension and TIA.  Pertinent negatives for past medical history include no MI.  Procedure history is positive for stress thallium (12 days ago; negative).    Past Medical History:  Diagnosis Date  . AAA (abdominal aortic aneurysm) ([place])   . Carotid artery occlusion   . Chronic kidney disease   . Coronary artery disease   . Dementia   . GERD (gastroesophageal reflux disease)   . Hyperlipidemia   . Hypertension   . Peripheral vascular disease ([place])   . S/P colonoscopy 01/12/12  . Stroke [facility])     Patient Active Problem List   Diagnosis Date Noted  . GERD (gastroesophageal reflux disease) 06/26/2017  . Chest pain 06/25/2017  . Pure hypercholesterolemia 08/27/2016  . Age-related osteoporosis without current pathological fracture 08/07/2016  . Coronary artery disease involving coronary bypass graft of native heart with angina pectoris ([place]) 06/13/2015  . Alzheimer's disease 04/26/2015  . 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 artery ([place]) 04/26/2015  . 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 Benign localized hyperplasia of prostate with urinary obstruction 06/25/2014  . Chronic kidney disease (CKD), stage III (moderate) 06/25/2014  . COPD (chronic obstructive pulmonary disease) ([place]) 06/25/2014  . Essential hypertension 06/25/2014  . Occlusion and stenosis of carotid artery without mention of cerebral infarction 03/22/2014  . Aneurysm of abdominal vessel ([place]) 03/17/2012    Past Surgical History:  Procedure Laterality Date  . CARDIAC CATHETERIZATION  05/2015  . CORONARY ARTERY BYPASS GRAFT  2009   4 Vessel  . MASTOIDECTOMY         Home Medications    Prior to Admission medications   Medication Sig Start Date End Date Taking? Authorizing Provider  amLODipine (NORVASC) 5 MG tablet Take 1 tablet (5 mg total) by mouth daily. 06/27/17   Johnson, Clanford L, MD  aspirin EC 81 MG tablet Take 81 mg by mouth every other day.    [provider]  atorvastatin (LIPITOR) 80 MG tablet Take 80 mg by mouth daily.    [provider]  donepezil (ARICEPT) 10 MG tablet Take 10 mg by mouth every morning.     [provider]  febuxostat (ULORIC) 40 MG tablet Take 40 mg by mouth daily.     [provider]  memantine (NAMENDA XR) 28 MG CP24 24 hr capsule Take 1 capsule (28 mg total) by mouth daily. 06/27/17   Johnson, Clanford L, MD  metoprolol succinate (TOPROL-XL) 25  MG 24 hr tablet Take 12.5 mg by mouth daily.     [provider]  omeprazole (PRILOSEC) 40 MG capsule Take 1 capsule (40 mg total) by mouth daily. 06/27/17   Johnson, Clanford L, MD  Probiotic Product (PROBIOTIC & ACIDOPHILUS EX ST PO) Take 1 tablet by mouth daily.     [provider]  sodium bicarbonate 650 MG tablet Take 1 tablet (650 mg total) by mouth daily. 06/27/17   Johnson, Clanford L, MD  solifenacin (VESICARE) 10 MG tablet Take 10 mg by mouth at  bedtime.     [provider]  Tamsulosin HCl (FLOMAX) 0.4 MG CAPS Take 0.4 mg by mouth daily.    [provider]    Family History Family History  Problem Relation Age of Onset  . Coronary artery disease Mother     Social History Social History  Substance Use Topics  . Smoking status: Former Smoker    Quit date: 06/27/2008  . Smokeless tobacco: Never Used  . Alcohol use 4.2 oz/week    7 Cans of beer per week     Allergies   Ace inhibitors; Codeine; Hctz [hydrochlorothiazide]; and Nitroglycerin   Review of Systems Review of Systems  Constitutional: Negative for diaphoresis.  Respiratory: Negative for cough and shortness of breath.   Cardiovascular: Positive for chest pain.  Gastrointestinal: Negative for abdominal pain and nausea.  All other systems are reviewed and are negative for acute change except as noted in the HPI    Physical Exam Updated Vital Signs BP (!) 179/67   Pulse 64   Temp 98.5 F (36.9 C) (Oral)   Resp 19   Ht 5' 5.5" (1.664 m)   Wt 67.6 kg (149 lb)   SpO2 93%   BMI 24.42 kg/m   Physical Exam  Constitutional: He is oriented to person, place, and time. He appears well-developed and well-nourished. No distress.  HENT:  Head: Normocephalic and atraumatic.  Nose: Nose normal.  Eyes: Pupils are equal, round, and reactive to light. Conjunctivae and EOM are normal. Right eye exhibits no discharge. Left eye exhibits no discharge. No scleral icterus.  Neck: Normal range of motion. Neck supple.  Cardiovascular: Normal rate and regular rhythm.  Exam reveals no gallop and no friction rub.   No murmur heard. Pulmonary/Chest: Effort normal and breath sounds normal. No stridor. No respiratory distress. He has no rales.  Abdominal: Soft. He exhibits no distension. There is no tenderness.  Musculoskeletal: He exhibits no edema or tenderness.  Neurological: He is alert and oriented to person, place, and time.  Skin: Skin is warm and dry. No  rash noted. He is not diaphoretic. No erythema.  Psychiatric: He has a normal mood and affect.  Vitals reviewed.    ED Treatments / Results  Labs (all labs ordered are listed, but only abnormal results are displayed) Labs Reviewed  CBC WITH DIFFERENTIAL/PLATELET - Abnormal; Notable for the following:       Result Value   Eosinophils Absolute 1.5 (*)    All other components within normal limits  COMPREHENSIVE METABOLIC PANEL - Abnormal; Notable for the following:    Glucose, Bld 138 (*)    Creatinine, Ser 1.80 (*)    Calcium 8.7 (*)    GFR calc non Af Amer 35 (*)    GFR calc Af Amer 40 (*)    All other components within normal limits  TROPONIN I  TROPONIN I    EKG  EKG Interpretation  Date/Time:  Tuesday July 08 2017 19:22:34 EDT Ventricular Rate:  69 PR Interval:  148 QRS Duration: 82 QT Interval:  428 QTC Calculation: 458 R Axis:   70 Text Interpretation:  Normal sinus rhythm Possible Left atrial enlargement Nonspecific ST abnormality Abnormal ECG No significant change since last tracing Confirmed by Addison Lank [phone]) on 07/08/2017 7:57:21 PM       Radiology Dg Chest 2 View  Result Date: 07/08/2017 CLINICAL DATA:  Chest pain EXAM: CHEST  2 VIEW COMPARISON:  06/25/2017 FINDINGS: COPD with hyperinflation and emphysema. Prior CABG. Lungs are clear and unchanged from the prior study. Negative for heart failure or pneumonia. Minimal left effusion IMPRESSION: COPD without acute abnormality.  Minimal left effusion. Electronically Signed   By: Franchot Gallo M.D.   On: 07/08/2017 21:06    Procedures Procedures (including critical care time)  Medications Ordered in ED Medications  gi cocktail (Maalox,Lidocaine,Donnatal) (30 mLs Oral Given 07/08/17 2042)  nitroGLYCERIN (NITROGLYN) 2 % ointment 0.5 inch (0.5 inches Topical Given 07/08/17 2045)  famotidine (PEPCID) IVPB 20 mg premix (0 mg Intravenous Stopped 07/08/17 2242)     Initial Impression / Assessment and  Plan / ED Course  I have reviewed the triage vital signs and the nursing notes.  Pertinent labs & imaging results that were available during my care of the patient were reviewed by me and considered in my medical decision making (see chart for details).     Atypical chest pain. EKG without acute ischemic changes or evidence of pericarditis.  Initial troponin negative.  Patient with an extensive cardiac history however had recent admission for ACS rule out for similar pain and had negative stress testing.  No recent fevers or cough concerning for pneumonia. Low pretest probability for pulmonary embolism. Presentation is classic for aortic dissection or esophageal perforation.  Chest x-ray without evidence suggestive of pneumonia, pneumothorax, pneumomediastinum.  No abnormal contour of the mediastinum to suggest dissection. No evidence of acute injuries.  Provided with gi cocktail, pepcid, and NTG paste resulting in complete resolution of his pain.  Will delta trop. If negative, recommend close cardiac follow up.   Trop negative.  The patient is safe for discharge with strict return precautions.   Final Clinical Impressions(s) / ED Diagnoses   Final diagnoses:  Atypical chest pain   Disposition: Discharge  Condition: Good  I have discussed the results, Dx and Tx plan with the patient and wife who expressed understanding and agree(s) with the plan. Discharge instructions discussed at great length. The patient and wife were given strict return precautions who verbalized understanding of the instructions. No further questions at time of discharge.    New Prescriptions   No medications on file    Follow Up: Denton Brick., MD [address] [place] [address] [phone]  Schedule an appointment as soon as possible for a visit  For close follow up to assess for chest pain  Bernerd Limbo, MD [place]  [id number] [phone]  Schedule an appointment as soon as possible for a visit  As needed      Fatima Blank, MD 07/08/17 [phone]

## 2017-07-08 NOTE — ED Triage Notes (Signed)
Patient states that he started to have pain to his mid epigastric region and up into his central chest since 1 pm. Reports that he had similar feelings about 2 weeks ago and was seen at [facility] for this. They have changed his Reflux medications and ASA to help.

## 2017-07-08 NOTE — ED Notes (Signed)
Patient transported to X-ray 

## 2017-07-17 DIAGNOSIS — Z860100 Personal history of colon polyps, unspecified: Secondary | ICD-10-CM | POA: Insufficient documentation

## 2017-09-03 DIAGNOSIS — R7303 Prediabetes: Secondary | ICD-10-CM | POA: Insufficient documentation

## 2017-09-03 DIAGNOSIS — R0609 Other forms of dyspnea: Secondary | ICD-10-CM | POA: Insufficient documentation

## 2018-04-07 ENCOUNTER — Ambulatory Visit: Payer: Medicare Other | Admitting: Family

## 2018-04-07 ENCOUNTER — Other Ambulatory Visit (HOSPITAL_COMMUNITY): Payer: Medicare Other

## 2018-04-14 ENCOUNTER — Ambulatory Visit (HOSPITAL_COMMUNITY)
Admission: RE | Admit: 2018-04-14 | Discharge: 2018-04-14 | Disposition: A | Discharge status: Home / Self Care | Payer: Medicare Other | Source: Ambulatory Visit | Attending: Family | Admitting: Family

## 2018-04-14 ENCOUNTER — Ambulatory Visit (INDEPENDENT_AMBULATORY_CARE_PROVIDER_SITE_OTHER): Payer: Medicare Other | Admitting: Family

## 2018-04-14 ENCOUNTER — Other Ambulatory Visit: Payer: Self-pay

## 2018-04-14 ENCOUNTER — Encounter: Payer: Self-pay | Admitting: Family

## 2018-04-14 VITALS — BP 128/69 | HR 62 | Temp 97.2°F | Resp 18 | Ht 65.5 in | Wt 149.0 lb

## 2018-04-14 DIAGNOSIS — I714 Abdominal aortic aneurysm, without rupture, unspecified: Secondary | ICD-10-CM

## 2018-04-14 DIAGNOSIS — Z87891 Personal history of nicotine dependence: Secondary | ICD-10-CM

## 2018-04-14 NOTE — Progress Notes (Signed)
[facility] [place]   CC: Follow up Abdominal Aortic Aneurysm  History of Present Illness  Juan Montoya is a 77 y.o. (May 10, 1941) male whom Dr. Donnetta Hutching has monitored for an infrarenal abdominal aortic aneurysm. He's also been followed for many years with a carotid duplex. This is always showed some right external carotid stenosis but no evidence of internal carotid stenosis; at his last visit with Dr. Donnetta Hutching it was decided to discontinue carotid artery surveillance.   Previous studies demonstrate an AAA, measuring 4.6 cm in 2014. The patient does not have back or abdominal pain. The patient denies claudication type symptoms in his legs with walking, denies non healing wounds.  He takes 325 mg ASA daily.  He take 80 mg atorvastatin daily.  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 Cardiac stent placed in August 2016 after chest tightness, his cardiologist is Dr. Renda Rolls in [facility]. He took Brilinta for 6-8 months.  He fell in March 2017 and injured left hamstring by missing a step and landing on this, had physical and occupational therapy.  He has difficulty with endurance and dyspnea, secondary to COPD, with walking.   Wife states pt's blood pressure at home runs 193-790 systolic.   Diabetic: No Tobacco use: former smoker, quit in 2009    Past Medical History:  Diagnosis Date  . AAA (abdominal aortic aneurysm) ([place])   . Carotid artery occlusion   . Chronic kidney disease   . Coronary artery disease   . Dementia   . GERD (gastroesophageal reflux disease)   . Hyperlipidemia   . Hypertension   . Peripheral vascular disease ([place])   . S/P colonoscopy 01/12/12  . Stroke [facility])    Past Surgical History:  Procedure Laterality Date  . CARDIAC CATHETERIZATION  05/2015  . CORONARY ARTERY BYPASS GRAFT  2009   4 Vessel  . MASTOIDECTOMY     Social History Social History   Socioeconomic  History  . Marital status: Married    Spouse name: Not on file  . Number of children: Not on file  . Years of education: Not on file  . Highest education level: Not on file  Occupational History  . Not on file  Social Needs  . Financial resource strain: Not on file  . Food insecurity:    Worry: Not on file    Inability: Not on file  . Transportation needs:    Medical: Not on file    Non-medical: Not on file  Tobacco Use  . Smoking status: Former Smoker    Last attempt to quit: 06/27/2008    Years since quitting: 9.8  . Smokeless tobacco: Never Used  Substance and Sexual Activity  . Alcohol use: Yes    Alcohol/week: 4.2 oz    Types: 7 Cans of beer per week  . Drug use: No  . Sexual activity: Never  Lifestyle  . Physical activity:    Days per week: Not on file    Minutes per session: Not on file  . Stress: Not on file  Relationships  . Social connections:    Talks on phone: Not on file    Gets together: Not on file    Attends religious service: Not on file    Active member of club or organization: Not on file    Attends meetings of clubs or organizations: Not on file    Relationship status: Not on file  .  Intimate partner violence:    Fear of current or ex partner: Not on file    Emotionally abused: Not on file    Physically abused: Not on file    Forced sexual activity: Not on file  Other Topics Concern  . Not on file  Social History Narrative  . Not on file   Family History Family History  Problem Relation Age of Onset  . Coronary artery disease Mother     Current Outpatient Medications on File Prior to Visit  Medication Sig Dispense Refill  . amLODipine (NORVASC) 5 MG tablet Take 1 tablet (5 mg total) by mouth daily. (Patient taking differently: Take 10 mg by mouth daily. ) 30 tablet 0  . aspirin EC 81 MG tablet Take 81 mg by mouth every other day.    Marland Kitchen atorvastatin (LIPITOR) 80 MG tablet Take 80 mg by mouth daily.    Marland Kitchen donepezil (ARICEPT) 10 MG tablet Take  10 mg by mouth every morning.     Marland Kitchen KRILL OIL PO Take by mouth daily.    . memantine (NAMENDA XR) 28 MG CP24 24 hr capsule Take 1 capsule (28 mg total) by mouth daily. 30 capsule   . metoprolol succinate (TOPROL-XL) 25 MG 24 hr tablet Take 12.5 mg by mouth daily.     . Probiotic Product (PROBIOTIC & ACIDOPHILUS EX ST PO) Take 1 tablet by mouth daily.     . sodium bicarbonate 650 MG tablet Take 1 tablet (650 mg total) by mouth daily.    . solifenacin (VESICARE) 10 MG tablet Take 10 mg by mouth at bedtime.     . Tamsulosin HCl (FLOMAX) 0.4 MG CAPS Take 0.4 mg by mouth daily.    . febuxostat (ULORIC) 40 MG tablet Take 40 mg by mouth daily.     Marland Kitchen omeprazole (PRILOSEC) 40 MG capsule Take 1 capsule (40 mg total) by mouth daily. (Patient not taking: Reported on 04/14/2018) 30 capsule 0   No current facility-administered medications on file prior to visit.    Allergies  Allergen Reactions  . Codeine Itching and Other (See Comments)    nausea nausea nausea Other reaction(s): Itching nausea nausea nausea   . Nitroglycerin Other (See Comments)    Migraine Headach Other reaction(s): Other Causes severe headaches Other reaction(s): Other Causes severe headaches Causes severe headaches Causes severe headaches   . Ace Inhibitors Other (See Comments)    hyponatremia hyponatremia hyponatremia Other reaction(s): Other hyponatremia Other reaction(s): Other (See Comments) hyponatremia Other reaction(s): Other hyponatremia hyponatremia hyponatremia   . Hydrochlorothiazide Other (See Comments)    hyponatremia hyponatremia hyponatremia Other reaction(s): Other hyponatremia Other reaction(s): Other hyponatremia hyponatremia hyponatremia     ROS: See HPI for pertinent positives and negatives.  Physical Examination  Vitals:   04/14/18 0841  BP: 128/69  Pulse: 62  Resp: 18  Temp: (!) 97.2 F (36.2 C)  TempSrc: Oral  SpO2: 92%  Weight: 149 lb (67.6 kg)  Height: 5' 5.5"  (1.664 m)   Body mass index is 24.42 kg/m.  General: A&O x 3, WD, elderly male. HEENT: Grossly intact and WNL. Hearing aids in place. Pulmonary: Sym exp, respirations are non labored, fair air movement in all fields, CTAB, no rales, rhonchi, or wheezing. Cardiac: Regular rhythm and rate, no detected murmur.  Carotid Bruits Right Left   Positive Negative   Adominal aortic pulse is moderately palpable Radial pulses are 2+ palpable bilaterally  VASCULAR EXAM:                                                                                                         LE Pulses Right Left       FEMORAL  2+ palpable  2+ palpable        POPLITEAL  not palpable   not palpable       POSTERIOR TIBIAL  2+ palpable   2+ palpable        DORSALIS PEDIS      ANTERIOR TIBIAL 2+ palpable  2+ palpable     Gastrointestinal: soft, NTND, -G/R, - HSM, - masses palpated, - CVAT B. Musculoskeletal: M/S 5/5 throughout, Extremities without ischemic changes. Skin: No rashes, no ulcers, no cellulitis.   Neurologic: CN 2-12 intact  except has some hearing loss, Pain and light touch intact in extremities are intact, Motor exam as listed above. Psychiatric: Normal thought content, mood appropriate to clinical situation.     DATA  AAA Duplex: Previous size: (Date: 04-03-17) Technically difficult exam due to pt inability to tolerate pressure of the probe.  Oblique angles used to obtain abdominal measurements in a transverse plane. The largest aneurysm is a fusiform distal abdominal aortic aneurysm with intramural thrombus measuring 4.1 cm x 4.2 cm.  >50% right CIA stenosis, the left CIA was not thoroughly visualized.    Current size:  4.25 cm (Date: 04-14-18); Right CIA: 0.6 cm; Left CIA: 0.9 cm   Carotid Duplex (04-03-17): <40% bilateral ICA stenosis. Right ECA stenosis. Bilateral vertebral artery flow is antegrade.  Bilateral subclavian artery waveforms are normal.  No  significant change compared to the last exam on 03-22-14   Medical Decision Making  The patient is a 77 y.o. male who presents with asymptomatic AAA with slight increase in size to 4.25 cm today compared to 4.1 cm a year ago.    Based on this patient's exam and diagnostic studies, the patient will follow up in 1 year  with the following studies: AAA dupllex.  Consideration for repair of AAA would be made when the size is 5.0 cm, growth > 1 cm/yr, and symptomatic status.        The patient was given information about AAA including signs, symptoms, treatment, and how to minimize the risk of enlargement and rupture of aneurysms.    I emphasized the importance of maximal medical management including strict control of blood pressure, blood glucose, and lipid levels, antiplatelet agents, obtaining regular exercise, and continued cessation of smoking.   The patient was advised to call 911 should the patient experience sudden onset abdominal or back pain.   Thank you for allowing [place] to participate in this patient's care.  Clemon Chambers, RN, MSN, FNP-C [facility] [place] Office: [phone]  Clinic Physician: Early  04/14/2018, 8:53 AM

## 2018-04-14 NOTE — Patient Instructions (Signed)
Before your next abdominal ultrasound:  Take two Extra-Strength Gas-X capsules at bedtime the night before the test. Take another two Extra-Strength Gas-X capsules 3 hours before the test.  Avoid gas forming foods and beverages the day before the test.      Abdominal Aortic Aneurysm Blood pumps away from the heart through tubes (blood vessels) called arteries. Aneurysms are weak or damaged places in the wall of an artery. It bulges out like a balloon. An abdominal aortic aneurysm happens in the main artery of the body (aorta). It can burst or tear, causing bleeding inside the body. This is an emergency. It needs treatment right away. What are the causes? The exact cause is unknown. Things that could cause this problem include:  Fat and other substances building up in the lining of a tube.  Swelling of the walls of a blood vessel.  Certain tissue diseases.  Belly (abdominal) trauma.  An infection in the main artery of the body.  What increases the risk? There are things that make it more likely for you to have an aneurysm. These include:  Being over the age of 78 years old.  Having high blood pressure (hypertension).  Being a male.  Being white.  Being very overweight (obese).  Having a family history of aneurysm.  Using tobacco products.  What are the signs or symptoms? Symptoms depend on the size of the aneurysm and how fast it grows. There may not be symptoms. If symptoms occur, they can include:  Pain (belly, side, lower back, or groin).  Feeling full after eating a small amount of food.  Feeling sick to your stomach (nauseous), throwing up (vomiting), or both.  Feeling a lump in your belly that feels like it is beating (pulsating).  Feeling like you will pass out (faint).  How is this treated?  Medicine to control blood pressure and pain.  Imaging tests to see if the aneurysm gets bigger.  Surgery. How is this prevented? To lessen your chance of  getting this condition:  Stop smoking. Stop chewing tobacco.  Limit or avoid alcohol.  Keep your blood pressure, blood sugar, and cholesterol within normal limits.  Eat less salt.  Eat foods low in saturated fats and cholesterol. These are found in animal and whole dairy products.  Eat more fiber. Fiber is found in whole grains, vegetables, and fruits.  Keep a healthy weight.  Stay active and exercise often.  This information is not intended to replace advice given to you by your health care provider. Make sure you discuss any questions you have with your health care provider. Document Released: 02/08/2013 Document Revised: 03/21/2016 Document Reviewed: 11/13/2012 Elsevier Interactive Patient Education  2017 [employer]  

## 2018-08-14 ENCOUNTER — Emergency Department (HOSPITAL_BASED_OUTPATIENT_CLINIC_OR_DEPARTMENT_OTHER): Payer: Medicare Other

## 2018-08-14 ENCOUNTER — Other Ambulatory Visit: Payer: Self-pay

## 2018-08-14 ENCOUNTER — Emergency Department (HOSPITAL_BASED_OUTPATIENT_CLINIC_OR_DEPARTMENT_OTHER)
Admission: EM | Admit: 2018-08-14 | Discharge: 2018-08-14 | Disposition: A | Discharge status: Home / Self Care | Payer: Medicare Other | Attending: Emergency Medicine | Admitting: Emergency Medicine

## 2018-08-14 ENCOUNTER — Encounter (HOSPITAL_BASED_OUTPATIENT_CLINIC_OR_DEPARTMENT_OTHER): Payer: Self-pay | Admitting: *Deleted

## 2018-08-14 DIAGNOSIS — N183 Chronic kidney disease, stage 3 (moderate): Secondary | ICD-10-CM | POA: Insufficient documentation

## 2018-08-14 DIAGNOSIS — Z79899 Other long term (current) drug therapy: Secondary | ICD-10-CM | POA: Diagnosis not present

## 2018-08-14 DIAGNOSIS — I129 Hypertensive chronic kidney disease with stage 1 through stage 4 chronic kidney disease, or unspecified chronic kidney disease: Secondary | ICD-10-CM | POA: Insufficient documentation

## 2018-08-14 DIAGNOSIS — R0789 Other chest pain: Secondary | ICD-10-CM | POA: Diagnosis not present

## 2018-08-14 DIAGNOSIS — F0280 Dementia in other diseases classified elsewhere without behavioral disturbance: Secondary | ICD-10-CM | POA: Diagnosis not present

## 2018-08-14 DIAGNOSIS — Z8679 Personal history of other diseases of the circulatory system: Secondary | ICD-10-CM | POA: Diagnosis not present

## 2018-08-14 DIAGNOSIS — I2510 Atherosclerotic heart disease of native coronary artery without angina pectoris: Secondary | ICD-10-CM | POA: Diagnosis not present

## 2018-08-14 DIAGNOSIS — J449 Chronic obstructive pulmonary disease, unspecified: Secondary | ICD-10-CM | POA: Diagnosis not present

## 2018-08-14 DIAGNOSIS — R079 Chest pain, unspecified: Secondary | ICD-10-CM

## 2018-08-14 DIAGNOSIS — Z7982 Long term (current) use of aspirin: Secondary | ICD-10-CM | POA: Insufficient documentation

## 2018-08-14 DIAGNOSIS — G309 Alzheimer's disease, unspecified: Secondary | ICD-10-CM | POA: Diagnosis not present

## 2018-08-14 DIAGNOSIS — Z951 Presence of aortocoronary bypass graft: Secondary | ICD-10-CM | POA: Insufficient documentation

## 2018-08-14 DIAGNOSIS — Z87891 Personal history of nicotine dependence: Secondary | ICD-10-CM | POA: Diagnosis not present

## 2018-08-14 LAB — CBC
HCT: 47.6 % (ref 39.0–52.0)
Hemoglobin: 15.3 g/dL (ref 13.0–17.0)
MCH: 29.8 pg (ref 26.0–34.0)
MCHC: 32.1 g/dL (ref 30.0–36.0)
MCV: 92.6 fL (ref 80.0–100.0)
NRBC: 0 % (ref 0.0–0.2)
Platelets: 322 10*3/uL (ref 150–400)
RBC: 5.14 MIL/uL (ref 4.22–5.81)
RDW: 13.3 % (ref 11.5–15.5)
WBC: 9.3 10*3/uL (ref 4.0–10.5)

## 2018-08-14 LAB — TROPONIN I

## 2018-08-14 LAB — BASIC METABOLIC PANEL
ANION GAP: 10 (ref 5–15)
BUN: 25 mg/dL — ABNORMAL HIGH (ref 8–23)
CALCIUM: 9.4 mg/dL (ref 8.9–10.3)
CO2: 27 mmol/L (ref 22–32)
Chloride: 104 mmol/L (ref 98–111)
Creatinine, Ser: 1.77 mg/dL — ABNORMAL HIGH (ref 0.61–1.24)
GFR, EST AFRICAN AMERICAN: 41 mL/min — AB (ref >60)
GFR, EST NON AFRICAN AMERICAN: 35 mL/min — AB (ref >60)
Glucose, Bld: 148 mg/dL — ABNORMAL HIGH (ref 70–99)
Potassium: 3.6 mmol/L (ref 3.5–5.1)
SODIUM: 141 mmol/L (ref 135–145)

## 2018-08-14 MED ORDER — ASPIRIN 81 MG PO CHEW
324.0000 mg | CHEWABLE_TABLET | Freq: Once | ORAL | Status: AC
  Administered 2018-08-14: 324 mg via ORAL
  Filled 2018-08-14: qty 4

## 2018-08-14 MED ORDER — IOPAMIDOL (ISOVUE-370) INJECTION 76%
100.0000 mL | Freq: Once | INTRAVENOUS | Status: AC | PRN
  Administered 2018-08-14: 100 mL via INTRAVENOUS

## 2018-08-14 MED ORDER — MORPHINE SULFATE (PF) 4 MG/ML IV SOLN
4.0000 mg | Freq: Once | INTRAVENOUS | Status: AC
  Administered 2018-08-14: 4 mg via INTRAVENOUS
  Filled 2018-08-14: qty 1

## 2018-08-14 NOTE — ED Provider Notes (Signed)
[facility] EMERGENCY DEPARTMENT Provider Note   CSN: [id number] Arrival date & time: 08/14/18  3419     History   Chief Complaint Chief Complaint  Juan Montoya presents with  . Chest Pain    HPI Juan Montoya is a 77 y.o. male with PMH/o AAA, CKD, dementia, GERD, hyperlipidemia, hypertension, CAD who presents for evaluation of chest pain that began about 3 PM this evening.  He states that he was watching TV when all of a sudden he started getting a chest pain that extends about 3 inches below his midsternal region and extends upward across his chest.  He describes as a "heaviness and pressure."  He took Rolaids and Mylanta at home with no improvement.  He did not take any other medications.  He reports he did not get diaphoretic or nauseous with this pain.  Denies any shortness of breath.  He states that nothing made the pain worse, including exertion.  He states he is not having any associated shortness of breath.  He denies recent sicknesses such as fevers, cough, congestion.  Wife states that he follows with Dr. Olena Heckle (cardiology) and has had similar pain previously.  His work-up did not determine a specific cause of his pain.  Juan Montoya did not take any aspirin today.  He is not a current smoker.  He does have a history of hypertension but no diabetes.  The history is provided by the Juan Montoya.    Past Medical History:  Diagnosis Date  . AAA (abdominal aortic aneurysm) ([place])   . Carotid artery occlusion   . Chronic kidney disease   . Coronary artery disease   . Dementia ([place])   . GERD (gastroesophageal reflux disease)   . Hyperlipidemia   . Hypertension   . Peripheral vascular disease ([place])   . S/P colonoscopy 01/12/12  . Stroke [facility])     Juan Montoya Active Problem List   Diagnosis Date Noted  . GERD (gastroesophageal reflux disease) 06/26/2017  . Chest pain 06/25/2017  . Pure hypercholesterolemia 08/27/2016  . Age-related osteoporosis without current pathological  fracture 08/07/2016  . Coronary artery disease involving coronary bypass graft of native heart with angina pectoris ([place]) 06/13/2015  . Alzheimer's disease ([place]) 04/26/2015  . 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 artery ([place]) 04/26/2015  . 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 Benign localized hyperplasia of prostate with urinary obstruction 06/25/2014  . Chronic kidney disease (CKD), stage III (moderate) ([place]) 06/25/2014  . COPD (chronic obstructive pulmonary disease) ([place]) 06/25/2014  . Essential hypertension 06/25/2014  . Occlusion and stenosis of carotid artery without mention of cerebral infarction 03/22/2014  . Aneurysm of abdominal vessel ([place]) 03/17/2012    Past Surgical History:  Procedure Laterality Date  . CARDIAC CATHETERIZATION  05/2015  . CORONARY ARTERY BYPASS GRAFT  2009   4 Vessel  . MASTOIDECTOMY          Home Medications    Prior to Admission medications   Medication Sig Start Date End Date Taking? Authorizing Provider  allopurinol (ZYLOPRIM) 100 MG tablet Take 100 mg by mouth daily.   Yes [provider]  amLODipine (NORVASC) 5 MG tablet Take 1 tablet (5 mg total) by mouth daily. Juan Montoya taking differently: Take 10 mg by mouth daily.  06/27/17  Yes Johnson, Clanford L, MD  aspirin EC 81 MG tablet Take 81 mg by mouth every other day.  Yes [provider]  atorvastatin (LIPITOR) 80 MG tablet Take 80 mg by mouth daily.   Yes [provider]  donepezil (ARICEPT) 10 MG tablet Take 10 mg by mouth every morning.    Yes [provider]  fluticasone furoate-vilanterol (BREO ELLIPTA) 100-25 MCG/INH AEPB Inhale 1 puff into the lungs daily.   Yes [provider]  KRILL OIL PO Take by mouth daily.   Yes [provider]  memantine (NAMENDA XR) 28 MG CP24 24 hr capsule Take 1 capsule (28 mg  total) by mouth daily. 06/27/17  Yes Johnson, Clanford L, MD  metoprolol succinate (TOPROL-XL) 25 MG 24 hr tablet Take 12.5 mg by mouth daily.    Yes [provider]  omeprazole (PRILOSEC) 40 MG capsule Take 1 capsule (40 mg total) by mouth daily. 06/27/17  Yes Johnson, Clanford L, MD  Probiotic Product (PROBIOTIC & ACIDOPHILUS EX ST PO) Take 1 tablet by mouth daily.    Yes [provider]  sodium bicarbonate 650 MG tablet Take 1 tablet (650 mg total) by mouth daily. 06/27/17  Yes Johnson, Clanford L, MD  solifenacin (VESICARE) 10 MG tablet Take 10 mg by mouth at bedtime.    Yes [provider]  Solifenacin Succinate (VESICARE PO) Take by mouth.   Yes [provider]  Tamsulosin HCl (FLOMAX) 0.4 MG CAPS Take 0.4 mg by mouth daily.   Yes [provider]  febuxostat (ULORIC) 40 MG tablet Take 40 mg by mouth daily.     [provider]    Family History Family History  Problem Relation Age of Onset  . Coronary artery disease Mother     Social History Social History   Tobacco Use  . Smoking status: Former Smoker    Last attempt to quit: 06/27/2008    Years since quitting: 10.1  . Smokeless tobacco: Never Used  Substance Use Topics  . Alcohol use: Yes    Alcohol/week: 7.0 standard drinks    Types: 7 Cans of beer per week  . Drug use: No     Allergies   Codeine; Nitroglycerin; Ace inhibitors; and Hydrochlorothiazide   Review of Systems Review of Systems  Constitutional: Negative for fever.  Respiratory: Negative for cough and shortness of breath.   Cardiovascular: Positive for chest pain.  Gastrointestinal: Negative for abdominal pain, nausea and vomiting.  Genitourinary: Negative for dysuria and hematuria.  Neurological: Negative for headaches.  All other systems reviewed and are negative.    Physical Exam Updated Vital Signs BP 134/70   Pulse 74   Temp 97.9 F (36.6 C) (Oral)   Resp 10   Ht 5' 5.5" (1.664 m)   Wt  68.9 kg   SpO2 90%   BMI 24.91 kg/m   Physical Exam  Constitutional: He is oriented to person, place, and time. He appears well-developed and well-nourished.  Appears uncomfortable but no acute distress   HENT:  Head: Normocephalic and atraumatic.  Mouth/Throat: Oropharynx is clear and moist and mucous membranes are normal.  Eyes: Pupils are equal, round, and reactive to light. Conjunctivae, EOM and lids are normal.  Neck: Full passive range of motion without pain.  Cardiovascular: Normal rate, regular rhythm, normal heart sounds and normal pulses. Exam reveals no gallop and no friction rub.  No murmur heard. Pulses:      Radial pulses are 2+ on the right side, and 2+ on the left side.  Pulmonary/Chest: Effort normal and breath sounds normal.  Lungs clear to auscultation  bilaterally.  Symmetric chest rise.  No wheezing, rales, rhonchi.  Abdominal: Soft. Normal appearance. There is no tenderness. There is no rigidity and no guarding.  Abdomen is soft, non-distended, non-tender. No rigidity, No guarding. No peritoneal signs.  Musculoskeletal: Normal range of motion.  Neurological: He is alert and oriented to person, place, and time.  Skin: Skin is warm and dry. Capillary refill takes less than 2 seconds.  Psychiatric: He has a normal mood and affect. His speech is normal.  Nursing note and vitals reviewed.    ED Treatments / Results  Labs (all labs ordered are listed, but only abnormal results are displayed) Labs Reviewed  BASIC METABOLIC PANEL - Abnormal; Notable for the following components:      Result Value   Glucose, Bld 148 (*)    BUN 25 (*)    Creatinine, Ser 1.77 (*)    GFR calc non Af Amer 35 (*)    GFR calc Af Amer 41 (*)    All other components within normal limits  CBC  TROPONIN I  TROPONIN I    EKG None  Radiology Dg Chest 2 View  Result Date: 08/14/2018 CLINICAL DATA:  Onset chest pain today. EXAM: CHEST - 2 VIEW COMPARISON:  CT chest 10/22/2017.  PA  and lateral chest 07/08/2017. FINDINGS: The lungs are emphysematous but clear. Heart size is normal. The Juan Montoya is status post CABG. No pneumothorax or pleural fluid. No acute or focal bony abnormality. IMPRESSION: Emphysema without acute disease. Electronically Signed   By: Inge Rise M.D.   On: 08/14/2018 19:28   Ct Angio Chest/abd/pel For Dissection W And/or Wo Contrast  Result Date: 08/14/2018 CLINICAL DATA:  Chest pain since this afternoon. EXAM: CT ANGIOGRAPHY CHEST, ABDOMEN AND PELVIS TECHNIQUE: Multidetector CT imaging through the chest, abdomen and pelvis was performed using the standard protocol during bolus administration of intravenous contrast. Multiplanar reconstructed images and MIPs were obtained and reviewed to evaluate the vascular anatomy. CONTRAST:  131mL ISOVUE-370 IOPAMIDOL (ISOVUE-370) INJECTION 76% COMPARISON:  Chest CT 10/12/2017 FINDINGS: CTA CHEST FINDINGS Cardiovascular: Ectasia of the ascending thoracic aorta is 3.7 cm at the level of the main pulmonary artery with atherosclerotic soft and hard plaque noted. No dissection is seen. Aortic caliber at the aortic root is approximately 2.8 cm, sinus of Valsalva approximately 2.9 cm, sinotubular junction 2.7 cm. Atherosclerosis of the great vessels without significant stenosis. Heart size is normal without pericardial effusion or thickening. Median sternotomy sutures in place and there is native coronary arteriosclerosis along the left main, lad, left circumflex and RCA, scattered in appearance. No large central pulmonary embolus. Mediastinum/Nodes: No enlarged mediastinal, hilar, or axillary lymph nodes. 15 mm hypodense nodule in the left lobe of the liver with adjacent 6 mm hypodense nodule. These appear stable. Mild fluid-filled mid to distal esophagus associated small hiatal hernia may be secondary to reflux. Patent trachea and mainstem bronchi. Lungs/Pleura: Scarring at the lung apices. Centrilobular emphysema is noted  bilaterally. Subsegmental atelectasis is seen at each lung base. Few tiny nodular densities are noted in the periphery of the left lower lobe, largest measuring 4 mm. Musculoskeletal: No chest wall abnormality. No acute or significant osseous findings. Review of the MIP images confirms the above findings. CTA ABDOMEN AND PELVIS FINDINGS VASCULAR Aorta: Fusiform infrarenal dilatation of the abdominal aorta measuring 4.4 cm x 4.7 cm in AP by transverse dimension, series 4/38 terminating at the aortic bifurcation. Mild ulcerated plaque suggested along the left lateral aspect of the infrarenal aorta.  No aneurysmal leak. Celiac: Patent without evidence of aneurysm, dissection, vasculitis or significant stenosis. Atherosclerotic origin without significant stenosis. SMA: Atherosclerotic origin without thrombosis. No dissection or aneurysm. Renals: Mild atherosclerotic origins with no significant stenosis, aneurysm or evidence of fibromuscular dysplasia. IMA: Patent along its course. Inflow: Mid atherosclerosis of both common iliac arteries and branch vessels with aneurysmal dilatation and/or ectasia of the proximal left common iliac artery measuring up to 17 mm. Probable ulcerated plaque noted also just beyond the aortic bifurcation of the left. Veins: No obvious venous abnormality within the limitations of this arterial phase study. Review of the MIP images confirms the above findings. NON-VASCULAR Hepatobiliary: No focal liver abnormality is seen. No gallstones, gallbladder wall thickening, or biliary dilatation. Pancreas: Unremarkable. No pancreatic ductal dilatation or surrounding inflammatory changes. Spleen: Normal size spleen. Adrenals/Urinary Tract: No adrenal mass. Water attenuating cysts of both kidneys without nephrolithiasis, enhancing mass nor obstructive uropathy. Bilateral renal cortical thinning. Stomach/Bowel: Small hiatal hernia. Physiologic distention of the stomach. The duodenal sweep is unremarkable.  Mild fluid-filled distention of jejunal loops question enteritis. Moderate stool retention within the colon without inflammation. Appendix is not confidently identified but no pericecal inflammatory change is noted. Descending and sigmoid colonic diverticulosis without acute diverticulitis. Lymphatic: No adenopathy. Reproductive: Normal size prostate. Other: Fat containing right inguinal hernia. Musculoskeletal: No acute or significant osseous findings. Review of the MIP images confirms the above findings. IMPRESSION: Chest CT: 1. 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 No dissection or aneurysm. 2. Centrilobular emphysema with apical pleuroparenchymal scarring. Bibasilar atelectasis is also noted. 3. Coronary arteriosclerosis.  Status post median sternotomy. 4. 4 mm or less nodular densities in the subpleural left lower lobe. No follow-up needed if Juan Montoya is low-risk (and has no known or suspected primary neoplasm). Non-contrast chest CT can be considered in 12 months if Juan Montoya is high-risk. This recommendation follows the consensus statement: Guidelines for Management of Incidental Pulmonary Nodules Detected on CT Images: From the Fleischner Society 2017; Radiology 2017; 284:228-243. CT AP: 1. Fusiform infrarenal abdominal aortic aneurysm without leak but with soft and hard plaque as well as ulcerated plaque. The infrarenal abdominal aorta measures 4.4 x 4.7 cm in AP by transverse dimension. Recommend followup by abdomen and pelvis CTA in 6 months, and vascular surgery referral/consultation if not already obtained. This recommendation follows ACR consensus guidelines: White Paper of the ACR Incidental Findings Committee II on Vascular Findings. J Am Coll Radiol 2013; 10:789-794. 2. 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 3. Cortical atrophy of both kidneys with water attenuating cysts bilaterally. 4. Fat containing right inguinal hernia. Electronically Signed    By: Ashley Royalty M.D.   On: 08/14/2018 21:17    Procedures Procedures (including critical care time)  Medications Ordered in ED Medications  morphine 4 MG/ML injection 4 mg (4 mg Intravenous Given 08/14/18 1950)  aspirin chewable tablet 324 mg (324 mg Oral Given 08/14/18 1948)  iopamidol (ISOVUE-370) 76 % injection 100 mL (100 mLs Intravenous Contrast Given 08/14/18 2025)     Initial Impression / Assessment and Plan / ED Course  I have reviewed the triage vital signs and the nursing notes.  Pertinent labs & imaging results that were available during my care of the Juan Montoya were reviewed by me and considered in my medical decision making (see chart for details).     77 y.o. M with PMH/o AAA, CKD, dementia, GERD, hyperlipidemia, hypertension, CAD that began at 3 PM.  No associated  shortness of breath, diaphoresis, nausea.  He took Rolaids and Mylanta at home with no improvement in symptoms.  He states that the chest pain had persisted so she brought him to the ED for further evaluation.  He states he has had similar pain before and had been evaluated by his cardiologist Dr. Olena Heckle and states that they cannot find to conclusive reason for his pain.  She states that he has not had any vomiting or difficulty breathing since onset of symptoms.  He describes it as a pressure in the central of his chest.  He did not take any aspirin prior to coming to the ED. Juan Montoya is afebrile, non-toxic appearing, sitting comfortably on examination table.  Vital signs reviewed.  Juan Montoya is slightly hypertensive.  Consider ACS etiology versus musculoskeletal pain.  Review of records show that Juan Montoya has a known AAA.  Low suspicion for rupture but given hypertension and pain, will plan for imaging evaluation.  Additionally, will plan for chest x-ray, EKG, basic labs.  AAA was evaluated in 2017 that showed it was stable at 4.2 cm.  He is followed by Dr. Donnetta Hutching (vascular).  BMP shows BUN of 25 and creatinine of 1.77.   This appears to be consistent with Juan Montoya's baseline.  Initial troponin is negative.  CBC is without any significant leukocytosis or anemia.  X-ray shows emphysema without acute disease.  CT and pelvis shows ascending thoracic aorta without dissection or aneurysm.  Additionally, he has intra-abdominal aneurysm that measures 4.4 x 4.7 cm.  Additionally, he has an aneurysmal dilatation of the left common iliac artery to 1.7 cm.  Additionally, he has a pulmonary nodule noted in the left lower lobe.  Discussed Juan Montoya with Dr. Tyrone Nine after independent evaluation with Juan Montoya.  Juan Montoya is currently pain-free at this time.  We discussed treatment options with both Juan Montoya and wife.  Given his history, he does have risk factors for CAD.  We offered admission but Juan Montoya does not want to be admitted and wishes to go home.  Will plan to delta troponin and reevaluate.  Delta troponin negative.  Discussed results with Juan Montoya.  He reports pain is completely resolved after analgesics here in the ED.  Vital signs are stable.  Juan Montoya does not wish to stay in the hospital for further work-up.  We will plan to discharge home. At this time, Juan Montoya exhibits no emergent life-threatening condition that require further evaluation in ED. Instructed Juan Montoya to call his cardiologist to arrange for an appointment.  Additionally, instructed Juan Montoya to call his vascular surgeon to update him on imaging done today. Juan Montoya had ample opportunity for questions and discussion. All Juan Montoya's questions were answered with full understanding. Strict return precautions discussed. Juan Montoya expresses understanding and agreement to plan.   Final Clinical Impressions(s) / ED Diagnoses   Final diagnoses:  Chest pain, unspecified type    ED Discharge Orders    None       Volanda Napoleon, PA-C 08/14/18 [place], DO 08/14/18 2317

## 2018-08-14 NOTE — ED Notes (Signed)
Discharge instructions reviewed with patient and wife.  Both voiced understanding.

## 2018-08-14 NOTE — Discharge Instructions (Signed)
Take your regular medications as directed.  As we discussed, call Dr. Donnetta Hutching on Monday to tell him that you had a CT scan and to see if there is any follow-up needed.  Call Dr. Olena Heckle to arrange for an outpatient appointment.  Return to the Emergency Department immediately if you experiencing worsening chest pain, difficulty breathing, nausea/vomiting, get very sweaty, headache or any other worsening or concerning symptoms.

## 2018-08-14 NOTE — ED Triage Notes (Signed)
Chest pian since this afternoon.

## 2018-08-14 NOTE — ED Notes (Signed)
Patient states he is not having any pain.  Continues with oxygen for sats.

## 2018-08-14 NOTE — ED Notes (Signed)
Oxygen removed

## 2018-08-14 NOTE — ED Notes (Signed)
Patient denies CP at this time.  Patient up and dressed to go home.  No complaints voiced.

## 2018-08-14 NOTE — ED Notes (Signed)
Patient presents with c/o CP since 330 this afternoon.  Stated he was watching TV and the pain started to the mid chest and felt like there was an elephant sitting on his chest.  Denies radiation, SOB, nausea.  Tried Maalox and Rolaids at home but the pain continued.  Has had this pain in the past.  Unable to take NTG.

## 2018-08-14 NOTE — ED Notes (Signed)
Oxygen 2liters via [place] due to sats dropping after receiving Morphine

## 2019-05-09 IMAGING — CT CT ANGIO CHEST-ABD-PELV FOR DISSECTION W/ AND WO/W CM
2 of 9 series · 11 of 36 positions shown, 14 images · IV contrast (APPLIED)
Comparison: Chest CT 10/12/2017

CLINICAL DATA: Chest pain since this afternoon.

EXAM:
CT ANGIOGRAPHY CHEST, ABDOMEN AND PELVIS
TECHNIQUE: Multidetector CT imaging through the chest, abdomen and pelvis was
performed using the standard protocol during bolus administration of
intravenous contrast. Multiplanar reconstructed images and MIPs were
obtained and reviewed to evaluate the vascular anatomy.
CONTRAST:  100mL WAEZJB-1DP IOPAMIDOL (WAEZJB-1DP) INJECTION 76%

[Series 4: axial arterial · axial · arterial · 0.71mm/px · z∈[+532,+1087]mm · 10 of 215 slices shown, 13 images]
[im 15/215  mediastinal]
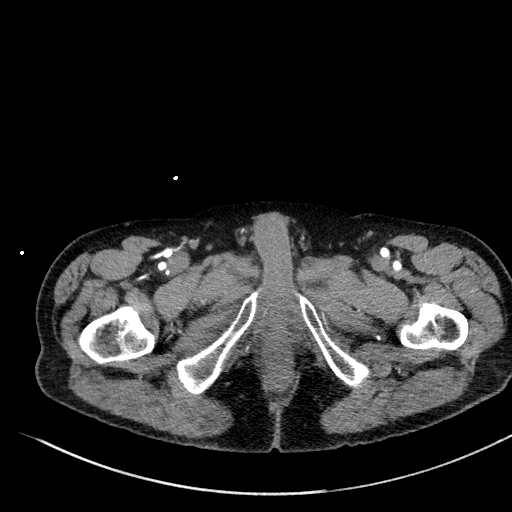
[im 15/215  bone]
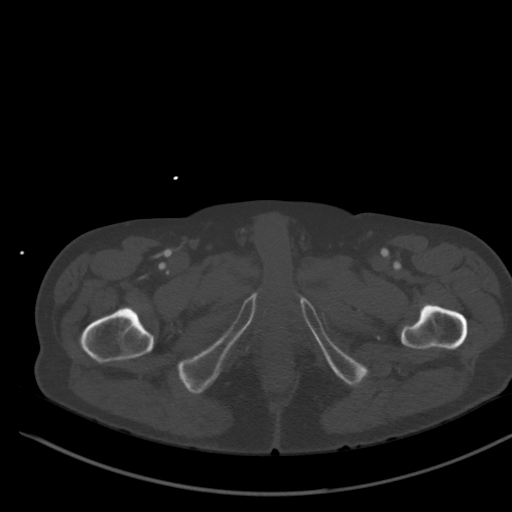
[im 43/215  mediastinal]
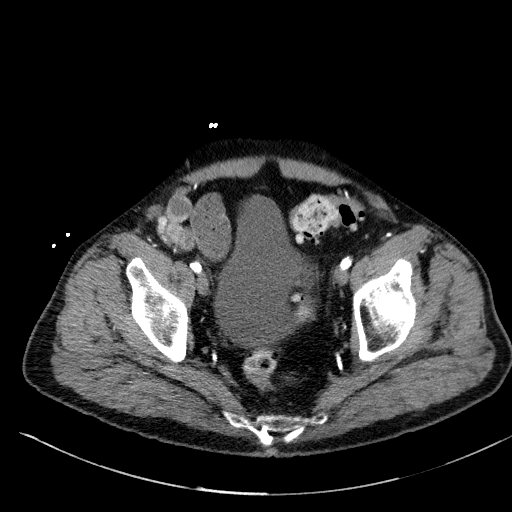
[im 72/215  mediastinal]
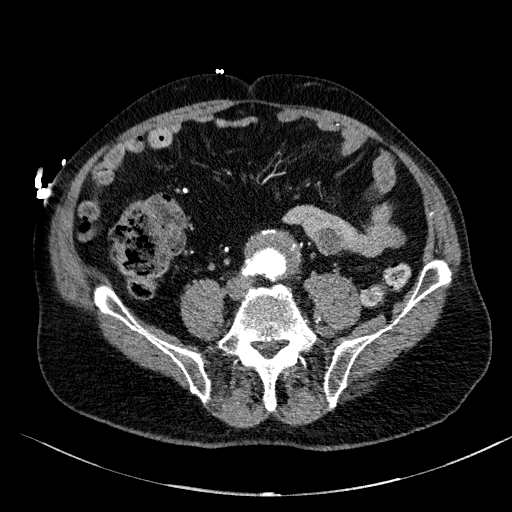
[im 100/215  mediastinal]
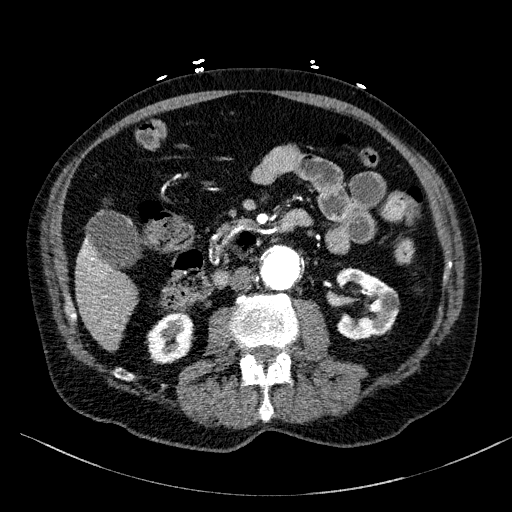
[im 115/215  mediastinal]
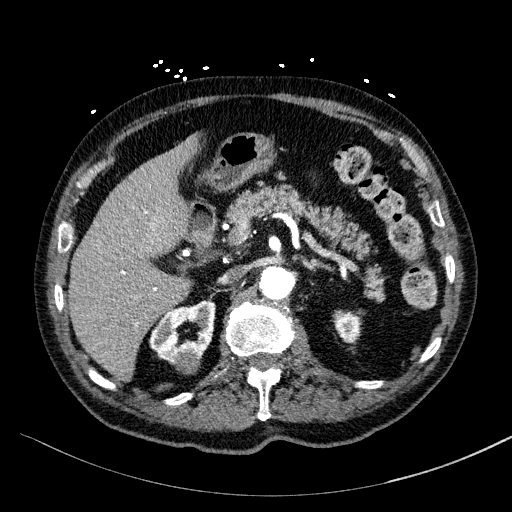
[im 143/215  mediastinal]
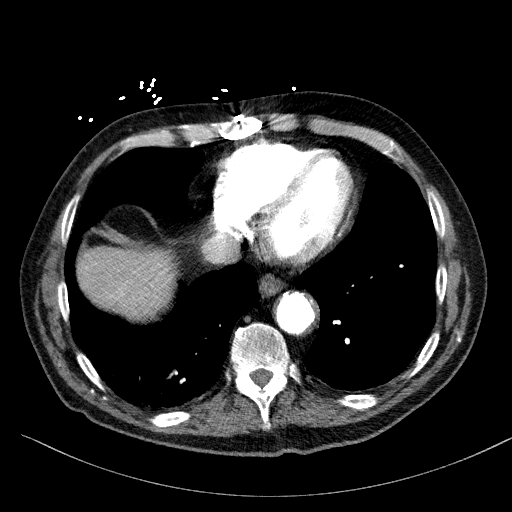
[im 157/215  lung]
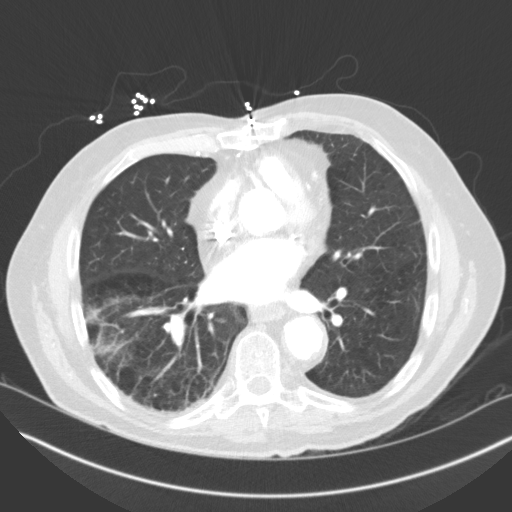
[im 172/215  mediastinal]
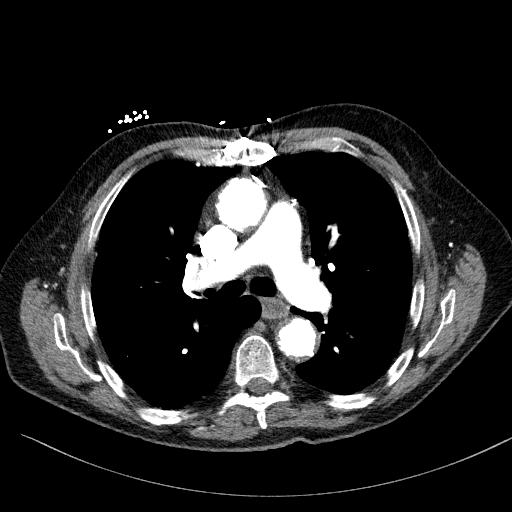
[im 172/215  lung]
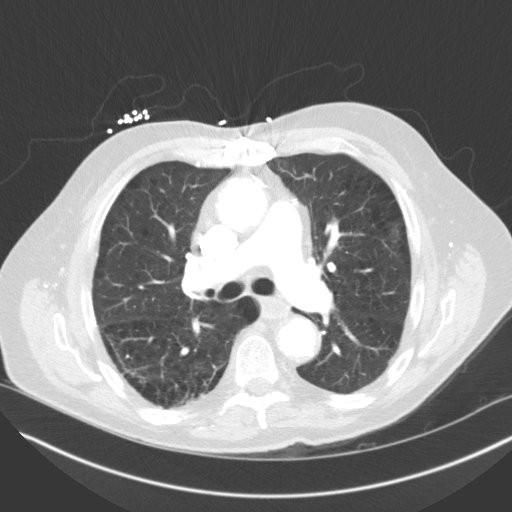
[im 186/215  lung]
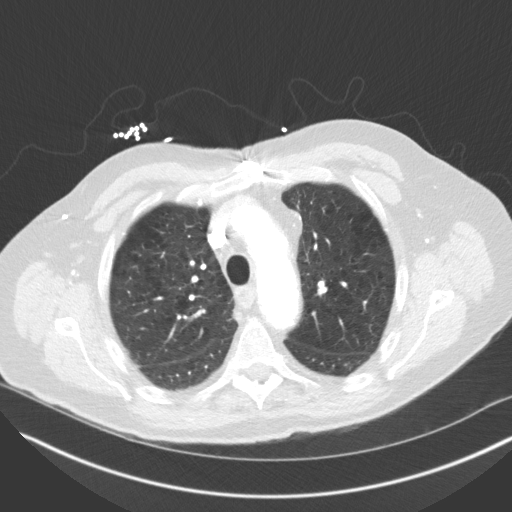
[im 200/215  mediastinal]
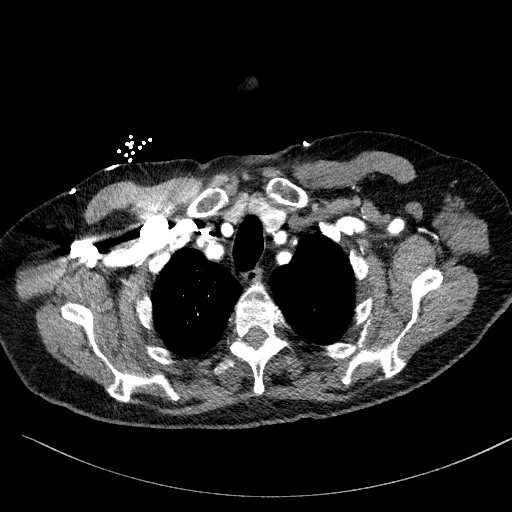
[im 200/215  lung]
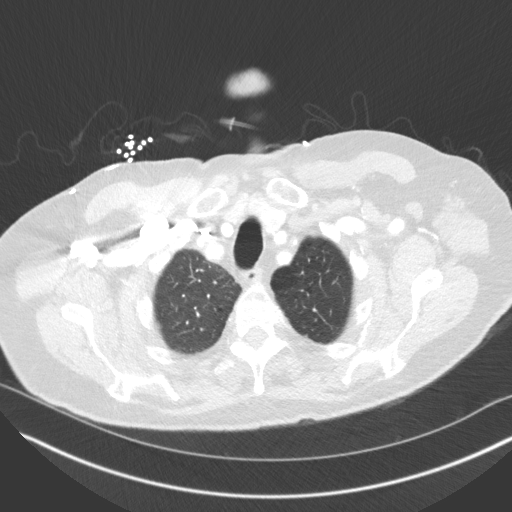

[Series 6: coronals · coronal · 0.67mm/px · 1 of 155 slices shown]
[im 78/155  mediastinal]
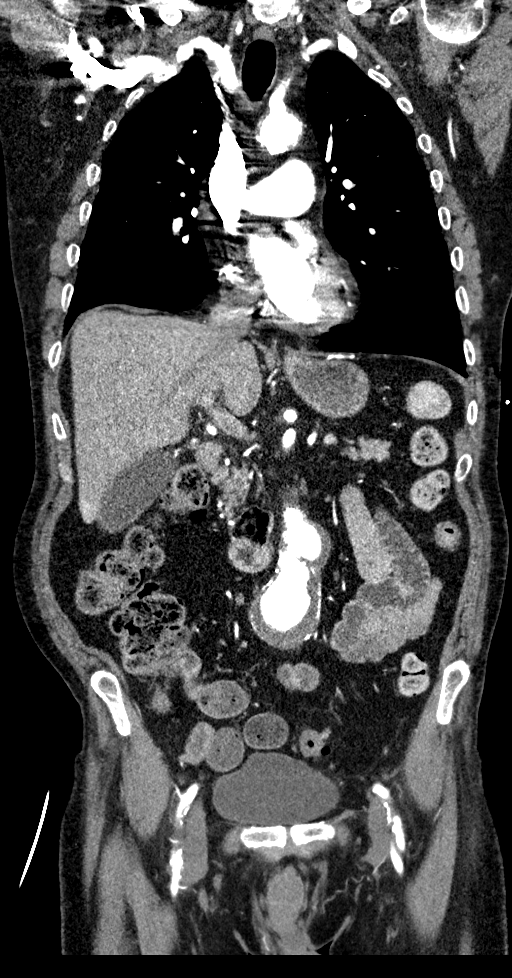

[11 of 36 positions shown; findings below may reference images not displayed]

FINDINGS: CTA CHEST FINDINGS

Cardiovascular: Ectasia of the ascending thoracic aorta is 3.7 cm at
the level of the main pulmonary artery with atherosclerotic soft and
hard plaque noted. No dissection is seen. Aortic caliber at the
aortic root is approximately 2.8 cm, sinus of Valsalva approximately
2.9 cm, sinotubular junction 2.7 cm. Atherosclerosis of the great
vessels without significant stenosis. Heart size is normal without
pericardial effusion or thickening. Median sternotomy sutures in
place and there is native coronary arteriosclerosis along the left
main, lad, left circumflex and RCA, scattered in appearance. No
large central pulmonary embolus.

Mediastinum/Nodes: No enlarged mediastinal, hilar, or axillary lymph
nodes. 15 mm hypodense nodule in the left lobe of the liver with
adjacent 6 mm hypodense nodule. These appear stable. Mild
fluid-filled mid to distal esophagus associated small hiatal hernia
may be secondary to reflux. Patent trachea and mainstem bronchi.

Lungs/Pleura: Scarring at the lung apices. Centrilobular emphysema
is noted bilaterally. Subsegmental atelectasis is seen at each lung
base. Few tiny nodular densities are noted in the periphery of the
left lower lobe, largest measuring 4 mm.

Musculoskeletal: No chest wall abnormality. No acute or significant
osseous findings.

Review of the MIP images confirms the above findings.

CTA ABDOMEN AND PELVIS FINDINGS

VASCULAR

Aorta: Fusiform infrarenal dilatation of the abdominal aorta
measuring 4.4 cm x 4.7 cm in AP by transverse dimension, series [DATE]
terminating at the aortic bifurcation. Mild ulcerated plaque
suggested along the left lateral aspect of the infrarenal aorta. No
aneurysmal leak..

Celiac: Patent without evidence of aneurysm, dissection, vasculitis
or significant stenosis. Atherosclerotic origin without significant
stenosis.

SMA: Atherosclerotic origin without thrombosis. No dissection or
aneurysm.

Renals: Mild atherosclerotic origins with no significant stenosis,
aneurysm or evidence of fibromuscular dysplasia.

IMA: Patent along its course.

Inflow: Mid atherosclerosis of both common iliac arteries and branch
vessels with aneurysmal dilatation and/or ectasia of the proximal
left common iliac artery measuring up to 17 mm. Probable ulcerated
plaque noted also just beyond the aortic bifurcation of the left.

Veins: No obvious venous abnormality within the limitations of this
arterial phase study.

Review of the MIP images confirms the above findings.

NON-VASCULAR

Hepatobiliary: No focal liver abnormality is seen. No gallstones,
gallbladder wall thickening, or biliary dilatation.

Pancreas: Unremarkable. No pancreatic ductal dilatation or
surrounding inflammatory changes.

Spleen: Normal size spleen.

Adrenals/Urinary Tract: No adrenal mass. Water attenuating cysts of
both kidneys without nephrolithiasis, enhancing mass nor obstructive
uropathy. Bilateral renal cortical thinning.

Stomach/Bowel: Small hiatal hernia. Physiologic distention of the
stomach. The duodenal sweep is unremarkable. Mild fluid-filled
distention of jejunal loops question enteritis. Moderate stool
retention within the colon without inflammation. Appendix is not
confidently identified but no pericecal inflammatory change is
noted. Descending and sigmoid colonic diverticulosis without acute
diverticulitis.

Lymphatic: No adenopathy.

Reproductive: Normal size prostate.

Other: Fat containing right inguinal hernia.

Musculoskeletal: No acute or significant osseous findings.

Review of the MIP images confirms the above findings.
IMPRESSION: Chest CT:

1. Ectatic ascending thoracic aorta to 3.7 cm with atherosclerosis.
No dissection or aneurysm.
2. Centrilobular emphysema with apical pleuroparenchymal scarring.
Bibasilar atelectasis is also noted.
3. Coronary arteriosclerosis.  Status post median sternotomy.
4. 4 mm or less nodular densities in the subpleural left lower lobe.
No follow-up needed if patient is low-risk (and has no known or
suspected primary neoplasm). Non-contrast chest CT can be considered
in 12 months if patient is high-risk. This recommendation follows
the consensus statement: Guidelines for Management of Incidental
Pulmonary Nodules Detected on CT Images: From the [HOSPITAL]

CT AP:

1. Fusiform infrarenal abdominal aortic aneurysm without leak but
with soft and hard plaque as well as ulcerated plaque. The
infrarenal abdominal aorta measures 4.4 x 4.7 cm in AP by transverse
dimension. Recommend followup by abdomen and pelvis CTA in 6 months,
and vascular surgery referral/consultation if not already obtained.
This recommendation follows ACR consensus guidelines: White Paper of
the ACR Incidental Findings Committee II on Vascular Findings. [HOSPITAL] 8360; [DATE].
2. Aneurysmal dilatation of the left common iliac artery to 1.7 cm.
3. Cortical atrophy of both kidneys with water attenuating cysts
bilaterally.
4. Fat containing right inguinal hernia.

## 2019-06-25 ENCOUNTER — Other Ambulatory Visit: Payer: Self-pay | Admitting: Family Medicine

## 2019-06-25 DIAGNOSIS — M810 Age-related osteoporosis without current pathological fracture: Secondary | ICD-10-CM

## 2019-07-20 DIAGNOSIS — J9611 Chronic respiratory failure with hypoxia: Secondary | ICD-10-CM | POA: Insufficient documentation

## 2019-07-30 ENCOUNTER — Other Ambulatory Visit: Payer: Self-pay | Admitting: Family Medicine

## 2019-07-30 DIAGNOSIS — M810 Age-related osteoporosis without current pathological fracture: Secondary | ICD-10-CM

## 2019-09-03 ENCOUNTER — Other Ambulatory Visit: Payer: Self-pay

## 2019-09-03 DIAGNOSIS — I714 Abdominal aortic aneurysm, without rupture, unspecified: Secondary | ICD-10-CM

## 2019-09-10 ENCOUNTER — Other Ambulatory Visit: Payer: Self-pay

## 2019-09-10 ENCOUNTER — Encounter: Payer: Self-pay | Admitting: Family

## 2019-09-10 ENCOUNTER — Ambulatory Visit (HOSPITAL_COMMUNITY)
Admission: RE | Admit: 2019-09-10 | Discharge: 2019-09-10 | Disposition: A | Discharge status: Home / Self Care | Payer: Medicare Other | Source: Ambulatory Visit | Attending: Family | Admitting: Family

## 2019-09-10 ENCOUNTER — Ambulatory Visit: Payer: Medicare Other | Admitting: Family

## 2019-09-10 VITALS — BP 160/78 | HR 64 | Temp 97.1°F | Resp 18 | Ht 66.5 in | Wt 152.0 lb

## 2019-09-10 DIAGNOSIS — I714 Abdominal aortic aneurysm, without rupture, unspecified: Secondary | ICD-10-CM

## 2019-09-10 DIAGNOSIS — Z87891 Personal history of nicotine dependence: Secondary | ICD-10-CM | POA: Diagnosis not present

## 2019-09-10 NOTE — Progress Notes (Signed)
[facility] [place]   CC: Follow up Abdominal Aortic Aneurysm  History of Present Illness  Juan Montoya is a 78 y.o. (08-02-1941) male whom Dr. Donnetta Hutching has monitored for an infrarenal abdominal aortic aneurysm. He's also been followed for many years with a carotid duplex. This is always showed some right external carotid stenosis but no evidence of internal carotid stenosis; at his last visit with Dr. Donnetta Hutching it was decided to discontinue carotid artery surveillance.   Previous studies demonstrate an AAA, measuring 4.6 cm in 2014. The patient does not have back or abdominal pain. The patient denies claudication type symptoms in his legs with walking, denies non healing wounds.  He takes 81 mg ASA daily.  He take 80 mg atorvastatin daily.  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 Cardiac stent placed in August 2016 after chest tightness, his cardiologist is Dr. Renda Rolls in [facility]. He took Brilinta for 6-8 months.  He fell in March 2017 and injured left hamstring by missing a step and landing on this, had physical and occupational therapy.  He has difficulty with endurance and dyspnea, secondary to COPD, with walking.   He had a needle bx of his prostate, receiving anti testosterone injections.   Wife states pt's blood pressure at home runs [id number] systolic.  Diabetic: No Tobacco use: former smoker, quit in 2009    Past Medical History:  Diagnosis Date  . AAA (abdominal aortic aneurysm) ([place])   . Carotid artery occlusion   . Chronic kidney disease   . Coronary artery disease   . Dementia ([place])   . GERD (gastroesophageal reflux disease)   . Hyperlipidemia   . Hypertension   . Peripheral vascular disease ([place])   . S/P colonoscopy 01/12/12  . Stroke [facility])    Past Surgical History:  Procedure Laterality Date  . CARDIAC CATHETERIZATION  05/2015  . CORONARY ARTERY BYPASS GRAFT  2009    4 Vessel  . MASTOIDECTOMY     Social History Social History   Socioeconomic History  . Marital status: Married    Spouse name: Not on file  . Number of children: Not on file  . Years of education: Not on file  . Highest education level: Not on file  Occupational History  . Not on file  Social Needs  . Financial resource strain: Not on file  . Food insecurity    Worry: Not on file    Inability: Not on file  . Transportation needs    Medical: Not on file    Non-medical: Not on file  Tobacco Use  . Smoking status: Former Smoker    Quit date: 06/27/2008    Years since quitting: 11.2  . Smokeless tobacco: Never Used  Substance and Sexual Activity  . Alcohol use: Yes    Alcohol/week: 7.0 standard drinks    Types: 7 Cans of beer per week  . Drug use: No  . Sexual activity: Never  Lifestyle  . Physical activity    Days per week: Not on file    Minutes per session: Not on file  . Stress: Not on file  Relationships  . Social Herbalist on phone: Not on file    Gets together: Not on file    Attends religious service: Not on file    Active member of club or organization: Not on file    Attends meetings of clubs or  organizations: Not on file    Relationship status: Not on file  . Intimate partner violence    Fear of current or ex partner: Not on file    Emotionally abused: Not on file    Physically abused: Not on file    Forced sexual activity: Not on file  Other Topics Concern  . Not on file  Social History Narrative  . Not on file   Family History Family History  Problem Relation Age of Onset  . Coronary artery disease Mother     Current Outpatient Medications on File Prior to Visit  Medication Sig Dispense Refill  . allopurinol (ZYLOPRIM) 100 MG tablet Take 100 mg by mouth daily.    Marland Kitchen amLODipine (NORVASC) 5 MG tablet Take 1 tablet (5 mg total) by mouth daily. (Patient taking differently: Take 10 mg by mouth daily. ) 30 tablet 0  . aspirin EC 81 MG  tablet Take 81 mg by mouth every other day.    Marland Kitchen atorvastatin (LIPITOR) 80 MG tablet Take 80 mg by mouth daily.    Marland Kitchen donepezil (ARICEPT) 10 MG tablet Take 10 mg by mouth every morning.     . febuxostat (ULORIC) 40 MG tablet Take 40 mg by mouth daily.     . fluticasone furoate-vilanterol (BREO ELLIPTA) 100-25 MCG/INH AEPB Inhale 1 puff into the lungs daily.    Marland Kitchen KRILL OIL PO Take by mouth daily.    . memantine (NAMENDA XR) 28 MG CP24 24 hr capsule Take 1 capsule (28 mg total) by mouth daily. 30 capsule   . metoprolol succinate (TOPROL-XL) 25 MG 24 hr tablet Take 12.5 mg by mouth daily.     Marland Kitchen omeprazole (PRILOSEC) 40 MG capsule Take 1 capsule (40 mg total) by mouth daily. 30 capsule 0  . Probiotic Product (PROBIOTIC & ACIDOPHILUS EX ST PO) Take 1 tablet by mouth daily.     . sodium bicarbonate 650 MG tablet Take 1 tablet (650 mg total) by mouth daily.    . solifenacin (VESICARE) 10 MG tablet Take 10 mg by mouth at bedtime.     . Solifenacin Succinate (VESICARE PO) Take by mouth.    . Tamsulosin HCl (FLOMAX) 0.4 MG CAPS Take 0.4 mg by mouth daily.     No current facility-administered medications on file prior to visit.    Allergies  Allergen Reactions  . Codeine Itching and Other (See Comments)    nausea nausea nausea Other reaction(s): Itching nausea nausea nausea   . Nitroglycerin Other (See Comments)    Migraine Headach Other reaction(s): Other Causes severe headaches Other reaction(s): Other Causes severe headaches Causes severe headaches Causes severe headaches   . Ace Inhibitors Other (See Comments)    hyponatremia hyponatremia hyponatremia Other reaction(s): Other hyponatremia Other reaction(s): Other (See Comments) hyponatremia Other reaction(s): Other hyponatremia hyponatremia hyponatremia   . Hydrochlorothiazide Other (See Comments)    hyponatremia hyponatremia hyponatremia Other reaction(s): Other hyponatremia Other reaction(s):  Other hyponatremia hyponatremia hyponatremia     ROS: See HPI for pertinent positives and negatives.  Physical Examination  Vitals:   09/10/19 0855  BP: (!) 160/78  Pulse: 64  Resp: 18  Temp: (!) 97.1 F (36.2 C)  TempSrc: Temporal  SpO2: 94%  Weight: 152 lb (68.9 kg)  Height: 5' 6.5" (1.689 m)   Body mass index is 24.17 kg/m.  General: A&O x 2, WD, elderly male accompanied by wife. HEENT: Grossly intact and WNL.  Pulmonary: Sym exp, respirations are non labored, good air  movement in all fileds, CTAB, no rales, rhonchi, or wheezes. Cardiac: Regular rhythm and rate, no detected murmur.  Carotid Bruits Right Left   Positive Negative   Adominal aortic pulse is not palpable Radial pulses are 2+ palpable                           VASCULAR EXAM:                                                                                                         LE Pulses Right Left       FEMORAL  2+ palpable  2+ palpable        POPLITEAL  not palpable   not palpable       POSTERIOR TIBIAL  2+ palpable   2+ palpable        DORSALIS PEDIS      ANTERIOR TIBIAL 2+ palpable  2+ palpable     Gastrointestinal: soft, NTND, -G/R, - HSM, - masses palpated, - CVAT B. Musculoskeletal: M/S 5/5 throughout, Extremities without ischemic changes. Skin: No rashes, no ulcers, no cellulitis.   Neurologic: CN 2-12 intact except has some hearing loss (hearing aids in place), Pain and light touch intact in extremities are intact except, Motor exam as listed above. Psychiatric: Normal thought content, mood appropriate to clinical situation.    DATA  AAA Duplex (09-10-19):  Abdominal Aorta Findings: +-----------+-------+----------+----------+--------+--------+--------+ Location   AP (cm)Trans (cm)PSV (cm/s)WaveformThrombusComments +-----------+-------+----------+----------+--------+--------+--------+ Proximal   2.44   2.76      43                                  +-----------+-------+----------+----------+--------+--------+--------+ Mid        3.93   3.20      96                                 +-----------+-------+----------+----------+--------+--------+--------+ Distal     4.49   4.42      26                                 +-----------+-------+----------+----------+--------+--------+--------+ RT CIA Prox0.7    -         209                                +-----------+-------+----------+----------+--------+--------+--------+ LT CIA Prox0.8    0.9       447                       tortuous +-----------+-------+----------+----------+--------+--------+--------+  Summary: Abdominal Aorta: Lobulated aortic aneurysm with the largest aortic diameter measuring 4.49 cms. Prior diamater 4.10 x 4.25 cm.   Previous size:  4.25 cm (Date: 04-14-18); Right CIA: 0.6 cm; Left CIA: 0.9  cm   Carotid Duplex (04-03-17): <40% bilateral ICA stenosis. Right ECA stenosis. Bilateral vertebral artery flow is antegrade.  Bilateral subclavian artery waveforms are normal. No significant change compared to the last exam on 03-22-14   Medical Decision Making  The patient is a 78 y.o. male who presents with asymptomatic AAA with a slight increase in size to 4.5 cm today compared to 4.25 cm on 04-14-18.   Based on this patient's exam and diagnostic studies, the patient will follow up in 6 months with the following studies: AAA duplex.  Consideration for repair of AAA would be made when the size is 5.5cm, growth > 1 cm/yr, and symptomatic status.  Abdominal aortic aneurysm less than 5-1/2 cm in diameter has less then 1/2% risk of rupture per year.        The patient was given information about AAA including signs, symptoms, treatment, and how to minimize the risk of enlargement and rupture of aneurysms.    I emphasized the importance of maximal medical management including strict control of blood pressure, blood glucose, and lipid  levels, antiplatelet agents, obtaining regular exercise, and continued cessation of smoking.   The patient was advised to call 911 should the patient experience sudden onset abdominal or back pain.   Thank you for allowing [place] to participate in this patient's care.  Clemon Chambers, RN, MSN, FNP-C [facility] [place] Office: [phone]  Clinic Physician: Donzetta Matters  09/10/2019, 9:21 AM

## 2019-09-10 NOTE — Patient Instructions (Signed)
Before your next abdominal ultrasound:  Avoid gas forming foods and beverages the day before the test.   Take two Extra-Strength Gas-X capsules at bedtime the night before the test. Take another two Extra-Strength Gas-X capsules in the middle of the night if you get up to the restroom, if not, first thing in the morning with water.  Do not chew gum.     Abdominal Aortic Aneurysm  An aneurysm is a bulge in one of the blood vessels that carry blood away from the heart (artery). It happens when blood pushes up against a weak or damaged place in the wall of an artery. An abdominal aortic aneurysm happens in the main artery of the body (aorta). Some aneurysms may not cause problems. If it grows, it can burst or tear, causing bleeding inside the body. This is an emergency. It needs to be treated right away. What are the causes? The exact cause of this condition is not known. What increases the risk? The following may make you more likely to get this condition:  Being a male who is 60 years of age or older.  Being white (Caucasian).  Using tobacco.  Having a family history of aneurysms.  Having the following conditions: ? Hardening of the arteries (arteriosclerosis). ? Inflammation of the walls of an artery (arteritis). ? Certain genetic conditions. ? Being very overweight (obesity). ? An infection in the wall of the aorta (infectious aortitis). ? High cholesterol. ? High blood pressure (hypertension). What are the signs or symptoms? Symptoms depend on the size of the aneurysm and how fast it is growing. Most grow slowly and do not cause any symptoms. If symptoms do occur, they may include:  Pain in the belly (abdomen), side, or back.  Feeling full after eating only small amounts of food.  Feeling a throbbing lump in the belly. Symptoms that the aneurysm has burst (ruptured) include:  Sudden, very bad pain in the belly, side, or back.  Feeling sick to your stomach (nauseous).   Throwing up (vomiting).  Feeling light-headed or passing out. How is this treated? Treatment for this condition depends on:  The size of the aneurysm.  How fast it is growing.  Your age.  Your risk of having it burst. If your aneurysm is smaller than 2 inches (5 cm), your doctor may manage it by:  Checking it often to see if it is getting bigger. You may have an imaging test (ultrasound) to check it every 3-6 months, every year, or every few years.  Giving you medicines to: ? Control blood pressure. ? Treat pain. ? Fight infection. If your aneurysm is larger than 2 inches (5 cm), you may need surgery to fix it. Follow these instructions at home: Lifestyle  Do not use any products that have nicotine or tobacco in them. This includes cigarettes, e-cigarettes, and chewing tobacco. If you need help quitting, ask your doctor.  Get regular exercise. Ask your doctor what types of exercise are best for you. Eating and drinking  Eat a heart-healthy diet. This includes eating plenty of: ? Fresh fruits and vegetables. ? Whole grains. ? Low-fat (lean) protein. ? Low-fat dairy products.  Avoid foods that are high in saturated fat and cholesterol. These foods include red meat and some dairy products.  Do not drink alcohol if: ? Your doctor tells you not to drink. ? You are pregnant, may be pregnant, or are planning to become pregnant.  If you drink alcohol: ? Limit how much you use   to:  0-1 drink a day for women.  0-2 drinks a day for men. ? Be aware of how much alcohol is in your drink. In the [place] one drink equals any of these:  One typical bottle of beer (12 oz).  One-half glass of wine (5 oz).  One shot of hard liquor (1 oz). General instructions  Take over-the-counter and prescription medicines only as told by your doctor.  Keep your blood pressure within normal limits. Ask your doctor what your blood pressure should be.  Have your blood sugar (glucose) level  and cholesterol levels checked regularly. Keep your blood sugar level and cholesterol levels within normal limits.  Avoid heavy lifting and activities that take a lot of effort. Ask your doctor what activities are safe for you.  Keep all follow-up visits as told by your doctor. This is important. ? Talk to your doctor about regular screenings to see if the aneurysm is getting bigger. Contact a doctor if you:  Have pain in your belly, side, or back.  Have a throbbing feeling in your belly.  Have a family history of aneurysms. Get help right away if you:  Have sudden, bad pain in your belly, side, or back.  Feel sick to your stomach.  Throw up.  Have trouble pooping (constipation).  Have trouble peeing (urinating).  Feel light-headed.  Have a fast heart rate when you stand.  Have sweaty skin that is cold to the touch (clammy).  Have shortness of breath.  Have a fever. These symptoms may be an emergency. Do not wait to see if the symptoms will go away. Get medical help right away. Call your local emergency services (911 in the [place] Do not drive yourself to the hospital. Summary  An aneurysm is a bulge in one of the blood vessels that carry blood away from the heart (artery). Some aneurysms may not cause problems.  You may need to have yours checked often. If it grows, it can burst or tear. This causes bleeding inside the body. It needs to be treated right away.  Follow instructions from your doctor about healthy lifestyle changes.  Keep all follow-up visits as told by your doctor. This is important. This information is not intended to replace advice given to you by your health care provider. Make sure you discuss any questions you have with your health care provider. Document Released: 02/08/2013 Document Revised: 02/01/2019 Document Reviewed: 05/23/2018 Elsevier Patient Education  2020 [employer]  

## 2019-09-14 ENCOUNTER — Other Ambulatory Visit: Payer: Self-pay

## 2019-09-14 DIAGNOSIS — I714 Abdominal aortic aneurysm, without rupture, unspecified: Secondary | ICD-10-CM

## 2019-10-15 ENCOUNTER — Ambulatory Visit
Admission: RE | Admit: 2019-10-15 | Discharge: 2019-10-15 | Disposition: A | Discharge status: Home / Self Care | Payer: Medicare Other | Source: Ambulatory Visit | Attending: Family Medicine | Admitting: Family Medicine

## 2019-10-15 ENCOUNTER — Other Ambulatory Visit: Payer: Self-pay

## 2019-10-15 DIAGNOSIS — M810 Age-related osteoporosis without current pathological fracture: Secondary | ICD-10-CM

## 2020-03-20 DIAGNOSIS — H9193 Unspecified hearing loss, bilateral: Secondary | ICD-10-CM | POA: Insufficient documentation

## 2020-04-01 ENCOUNTER — Encounter (HOSPITAL_BASED_OUTPATIENT_CLINIC_OR_DEPARTMENT_OTHER): Payer: Self-pay

## 2020-04-01 ENCOUNTER — Other Ambulatory Visit: Payer: Self-pay

## 2020-04-01 ENCOUNTER — Emergency Department (HOSPITAL_BASED_OUTPATIENT_CLINIC_OR_DEPARTMENT_OTHER)
Admission: EM | Admit: 2020-04-01 | Discharge: 2020-04-01 | Disposition: A | Discharge status: Home / Self Care | Payer: Medicare PPO | Attending: Emergency Medicine | Admitting: Emergency Medicine

## 2020-04-01 ENCOUNTER — Emergency Department (HOSPITAL_BASED_OUTPATIENT_CLINIC_OR_DEPARTMENT_OTHER): Payer: Medicare PPO

## 2020-04-01 DIAGNOSIS — R1033 Periumbilical pain: Secondary | ICD-10-CM | POA: Diagnosis not present

## 2020-04-01 DIAGNOSIS — Z79899 Other long term (current) drug therapy: Secondary | ICD-10-CM | POA: Insufficient documentation

## 2020-04-01 DIAGNOSIS — R109 Unspecified abdominal pain: Secondary | ICD-10-CM | POA: Diagnosis present

## 2020-04-01 DIAGNOSIS — N183 Chronic kidney disease, stage 3 unspecified: Secondary | ICD-10-CM | POA: Insufficient documentation

## 2020-04-01 DIAGNOSIS — I129 Hypertensive chronic kidney disease with stage 1 through stage 4 chronic kidney disease, or unspecified chronic kidney disease: Secondary | ICD-10-CM | POA: Insufficient documentation

## 2020-04-01 DIAGNOSIS — Z7982 Long term (current) use of aspirin: Secondary | ICD-10-CM | POA: Insufficient documentation

## 2020-04-01 DIAGNOSIS — I714 Abdominal aortic aneurysm, without rupture: Secondary | ICD-10-CM | POA: Diagnosis not present

## 2020-04-01 DIAGNOSIS — Z8673 Personal history of transient ischemic attack (TIA), and cerebral infarction without residual deficits: Secondary | ICD-10-CM | POA: Diagnosis not present

## 2020-04-01 DIAGNOSIS — F0280 Dementia in other diseases classified elsewhere without behavioral disturbance: Secondary | ICD-10-CM | POA: Diagnosis not present

## 2020-04-01 DIAGNOSIS — I2510 Atherosclerotic heart disease of native coronary artery without angina pectoris: Secondary | ICD-10-CM | POA: Insufficient documentation

## 2020-04-01 DIAGNOSIS — G309 Alzheimer's disease, unspecified: Secondary | ICD-10-CM | POA: Diagnosis not present

## 2020-04-01 DIAGNOSIS — Z87891 Personal history of nicotine dependence: Secondary | ICD-10-CM | POA: Diagnosis not present

## 2020-04-01 HISTORY — DX: Malignant (primary) neoplasm, unspecified: C80.1

## 2020-04-01 HISTORY — DX: Abdominal aortic aneurysm, without rupture: I71.4

## 2020-04-01 HISTORY — DX: Abdominal aortic aneurysm, without rupture, unspecified: I71.40

## 2020-04-01 HISTORY — DX: Age-related osteoporosis without current pathological fracture: M81.0

## 2020-04-01 LAB — COMPREHENSIVE METABOLIC PANEL
ALT: 18 U/L (ref 0–44)
AST: 25 U/L (ref 15–41)
Albumin: 3.9 g/dL (ref 3.5–5.0)
Alkaline Phosphatase: 96 U/L (ref 38–126)
Anion gap: 10 (ref 5–15)
BUN: 28 mg/dL — ABNORMAL HIGH (ref 8–23)
CO2: 25 mmol/L (ref 22–32)
Calcium: 9.2 mg/dL (ref 8.9–10.3)
Chloride: 99 mmol/L (ref 98–111)
Creatinine, Ser: 1.91 mg/dL — ABNORMAL HIGH (ref 0.61–1.24)
GFR calc Af Amer: 38 mL/min — ABNORMAL LOW (ref >60)
GFR calc non Af Amer: 32 mL/min — ABNORMAL LOW (ref >60)
Glucose, Bld: 230 mg/dL — ABNORMAL HIGH (ref 70–99)
Potassium: 3.8 mmol/L (ref 3.5–5.1)
Sodium: 134 mmol/L — ABNORMAL LOW (ref 135–145)
Total Bilirubin: 0.7 mg/dL (ref 0.3–1.2)
Total Protein: 7.5 g/dL (ref 6.5–8.1)

## 2020-04-01 LAB — URINALYSIS, MICROSCOPIC (REFLEX)
RBC / HPF: NONE SEEN RBC/hpf (ref 0–5)
Squamous Epithelial / HPF: NONE SEEN (ref 0–5)

## 2020-04-01 LAB — CBC
HCT: 45.1 % (ref 39.0–52.0)
Hemoglobin: 14.7 g/dL (ref 13.0–17.0)
MCH: 29.8 pg (ref 26.0–34.0)
MCHC: 32.6 g/dL (ref 30.0–36.0)
MCV: 91.5 fL (ref 80.0–100.0)
Platelets: 351 10*3/uL (ref 150–400)
RBC: 4.93 MIL/uL (ref 4.22–5.81)
RDW: 13.2 % (ref 11.5–15.5)
WBC: 11.8 10*3/uL — ABNORMAL HIGH (ref 4.0–10.5)
nRBC: 0 % (ref 0.0–0.2)

## 2020-04-01 LAB — URINALYSIS, ROUTINE W REFLEX MICROSCOPIC
Bilirubin Urine: NEGATIVE
Glucose, UA: NEGATIVE mg/dL
Hgb urine dipstick: NEGATIVE
Ketones, ur: NEGATIVE mg/dL
Leukocytes,Ua: NEGATIVE
Nitrite: NEGATIVE
Protein, ur: 30 mg/dL — AB
Specific Gravity, Urine: 1.02 (ref 1.005–1.030)
pH: 6 (ref 5.0–8.0)

## 2020-04-01 LAB — LIPASE, BLOOD: Lipase: 24 U/L (ref 11–51)

## 2020-04-01 NOTE — ED Provider Notes (Signed)
[facility] Provider Note   CSN: [id number] Arrival date & time: 04/01/20  1552     History Chief Complaint  Patient presents with  . Abdominal Pain    CIRILO CANNER is a 79 y.o. male.  Patient is a 79 year old male who presents with abdominal pain.  He has a history of dementia, chronic kidney disease, coronary artery disease and abdominal aortic aneurysm as well as hypertension hyperlipidemia.  His wife assists in history.  She states he has been complaining of pain in his abdomen since this morning.  He did not get out of bed until about 130.  He has had worsening pain throughout the day and eventually said he needed to come get it checked out.  He has been pointing to his mid abdomen on the right side.  He has had no nausea or vomiting.  No change in bowels.  No diarrhea.  No fevers.  No urinary symptoms.        Past Medical History:  Diagnosis Date  . AAA (abdominal aortic aneurysm) ([place])   . Abdominal aortic aneurysm (AAA) 3.0 cm to 5.5 cm in diameter in male [facility])   . Cancer [facility])    prostate cancer grade 7   . Carotid artery occlusion   . Chronic kidney disease   . Coronary artery disease   . Dementia ([place])   . GERD (gastroesophageal reflux disease)   . Hyperlipidemia   . Hypertension   . Osteoporosis   . Peripheral vascular disease ([place])   . S/P colonoscopy 01/12/12  . Stroke [facility])     Patient Active Problem List   Diagnosis Date Noted  . GERD (gastroesophageal reflux disease) 06/26/2017  . Chest pain 06/25/2017  . Pure hypercholesterolemia 08/27/2016  . Age-related osteoporosis without current pathological fracture 08/07/2016  . Coronary artery disease involving coronary bypass graft of native heart with angina pectoris ([place]) 06/13/2015  . Alzheimer's disease ([place]) 04/26/2015  . 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 artery ([place]) 04/26/2015  . 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 Benign localized hyperplasia of prostate with urinary obstruction 06/25/2014  . Chronic kidney disease (CKD), stage III (moderate) 06/25/2014  . COPD (chronic obstructive pulmonary disease) ([place]) 06/25/2014  . Essential hypertension 06/25/2014  . Occlusion and stenosis of carotid artery without mention of cerebral infarction 03/22/2014  . Aneurysm of abdominal vessel ([place]) 03/17/2012    Past Surgical History:  Procedure Laterality Date  . CARDIAC CATHETERIZATION  05/2015  . CORONARY ARTERY BYPASS GRAFT  2009   4 Vessel  . MASTOIDECTOMY    . PROSTATE SURGERY         Family History  Problem Relation Age of Onset  . Coronary artery disease Mother     Social History   Tobacco Use  . Smoking status: Former Smoker    Quit date: 06/27/2008    Years since quitting: 11.7  . Smokeless tobacco: Never Used  Substance Use Topics  . Alcohol use: Not Currently    Alcohol/week: 7.0 standard drinks    Types: 7 Cans of beer per week  . Drug use: No    Home Medications Prior to Admission medications   Medication Sig Start Date End Date Taking? Authorizing Provider  allopurinol (ZYLOPRIM) 100 MG tablet Take 100 mg by mouth daily.    [provider]  amLODipine (NORVASC) 5 MG tablet Take  1 tablet (5 mg total) by mouth daily. Patient taking differently: Take 10 mg by mouth daily.  06/27/17   Johnson, Clanford L, MD  aspirin EC 81 MG tablet Take 81 mg by mouth every other day.    [provider]  atorvastatin (LIPITOR) 80 MG tablet Take 80 mg by mouth daily.    [provider]  donepezil (ARICEPT) 10 MG tablet Take 10 mg by mouth every morning.     [provider]  febuxostat (ULORIC) 40 MG tablet Take 40 mg by mouth daily.     [provider]  fluticasone furoate-vilanterol (BREO ELLIPTA) 100-25 MCG/INH AEPB Inhale 1 puff into the lungs daily.    [provider]  KRILL OIL PO Take by mouth daily.    [provider]  memantine (NAMENDA XR) 28 MG CP24 24 hr capsule Take 1 capsule (28 mg total) by mouth daily. 06/27/17   Johnson, Clanford L, MD  metoprolol succinate (TOPROL-XL) 25 MG 24 hr tablet Take 12.5 mg by mouth daily.     [provider]  omeprazole (PRILOSEC) 40 MG capsule Take 1 capsule (40 mg total) by mouth daily. 06/27/17   Johnson, Clanford L, MD  Probiotic Product (PROBIOTIC & ACIDOPHILUS EX ST PO) Take 1 tablet by mouth daily.     [provider]  sodium bicarbonate 650 MG tablet Take 1 tablet (650 mg total) by mouth daily. 06/27/17   Johnson, Clanford L, MD  solifenacin (VESICARE) 10 MG tablet Take 10 mg by mouth at bedtime.     [provider]  Solifenacin Succinate (VESICARE PO) Take by mouth.    [provider]  Tamsulosin HCl (FLOMAX) 0.4 MG CAPS Take 0.4 mg by mouth daily.    [provider]    Allergies    Codeine, Nitroglycerin, Ace inhibitors, and Hydrochlorothiazide  Review of Systems   Review of Systems  Constitutional: Negative for chills, diaphoresis, fatigue and fever.  HENT: Negative for congestion, rhinorrhea and sneezing.   Eyes: Negative.   Respiratory: Negative for cough, chest tightness and shortness of breath.   Cardiovascular: Negative for chest pain and leg swelling.  Gastrointestinal: Positive for abdominal pain. Negative for blood in stool, diarrhea, nausea and vomiting.  Genitourinary: Negative for difficulty urinating, flank pain, frequency and hematuria.  Musculoskeletal: Negative for arthralgias and back pain.  Skin: Negative for rash.  Neurological: Negative for dizziness, speech difficulty, weakness, numbness and headaches.    Physical Exam Updated Vital Signs BP (!) 163/75   Pulse 71   Temp 97.7 F (36.5 C) (Oral)   Resp 19   Ht 5' 5.5" (1.664 m)   Wt 69.9 kg   SpO2 94%   BMI 25.24 kg/m   Physical Exam Constitutional:       Appearance: He is well-developed.  HENT:     Head: Normocephalic and atraumatic.  Eyes:     Pupils: Pupils are equal, round, and reactive to light.  Cardiovascular:     Rate and Rhythm: Normal rate and regular rhythm.     Heart sounds: Normal heart sounds.  Pulmonary:     Effort: Pulmonary effort is normal. No respiratory distress.     Breath sounds: Normal breath sounds. No wheezing or rales.  Chest:     Chest wall: No tenderness.  Abdominal:     General: Bowel sounds are normal.     Palpations: Abdomen is soft.     Tenderness: There is abdominal tenderness in the periumbilical area.  There is no guarding or rebound.  Musculoskeletal:        General: Normal range of motion.     Cervical back: Normal range of motion and neck supple.  Lymphadenopathy:     Cervical: No cervical adenopathy.  Skin:    General: Skin is warm and dry.     Findings: No rash.  Neurological:     Mental Status: He is alert.     Comments: Oriented to person and place, at baseline mental status     ED Results / Procedures / Treatments   Labs (all labs ordered are listed, but only abnormal results are displayed) Labs Reviewed  COMPREHENSIVE METABOLIC PANEL - Abnormal; Notable for the following components:      Result Value   Sodium 134 (*)    Glucose, Bld 230 (*)    BUN 28 (*)    Creatinine, Ser 1.91 (*)    GFR calc non Af Amer 32 (*)    GFR calc Af Amer 38 (*)    All other components within normal limits  CBC - Abnormal; Notable for the following components:   WBC 11.8 (*)    All other components within normal limits  URINALYSIS, ROUTINE W REFLEX MICROSCOPIC - Abnormal; Notable for the following components:   Protein, ur 30 (*)    All other components within normal limits  URINALYSIS, MICROSCOPIC (REFLEX) - Abnormal; Notable for the following components:   Bacteria, UA FEW (*)    All other components within normal limits  LIPASE, BLOOD    EKG None  Radiology CT Abdomen Pelvis Wo  Contrast  Result Date: 04/01/2020 CLINICAL DATA:  Abdominal pain EXAM: CT ABDOMEN AND PELVIS WITHOUT CONTRAST TECHNIQUE: Multidetector CT imaging of the abdomen and pelvis was performed following the standard protocol without IV contrast. COMPARISON:  CT angiography 08/15/2018 FINDINGS: Lower chest: Emphysematous changes and scarring again noted in the lung bases, right greater than left. No consolidative opacity or effusion. Coronary artery calcifications are present. Normal cardiac size. No pericardial effusion. Evidence of prior sternotomy and likely CABG. Hepatobiliary: No focal liver abnormality is seen. Dependently layering attenuation in the gallbladder may reflect biliary sludge. No calcified gallstones, gallbladder wall thickening, or biliary dilatation. Pancreas: Partial fatty replacement of the pancreas. No visible concerning lesion. No inflammation or ductal dilatation. Spleen: Normal in size without focal abnormality. Adrenals/Urinary Tract: Normal adrenal glands. Multiple cysts again seen throughout both kidneys including an intermediate attenuation (36 HU) 2.5 cm cyst in the posterior right upper pole which measured similar attenuation on prior angiography compatible with a proteinaceous/hemorrhagic cyst. Additional subcentimeter hypoattenuating foci in both kidneys too small to fully characterize on CT imaging but statistically likely benign. No new or concerning renal mass. No urolithiasis or hydronephrosis. Stomach/Bowel: Distal esophagus and stomach are unremarkable. Air-filled duodenal diverticulum about the third portion of the duodenum (2/30). No inflammation. Duodenum is otherwise unremarkable. No small bowel thickening or dilatation. High attenuation material seen in the distal small bowel and colon, may reflect ingested material. Partially air-filled, noninflamed appendix present in the right lower quadrant. Scattered colonic diverticula without focal pericolonic inflammation to suggest  diverticulitis. No colonic dilatation or wall thickening. No evidence of bowel obstruction. Vascular/Lymphatic: Extensive atherosclerotic calcification throughout the abdominal aorta and branch vessels. Somewhat saccular outpouching of the infrarenal abdominal aorta present superiorly with a larger fusiform infrarenal aneurysm extending to the bifurcation more inferiorly. Maximum diameter of the superior sac measures 3.9 cm, stable. Maximum diameter of the fusiform infrarenal aneurysm measures  up to 4.7 cm, stable. Fusiform ectasia of the left common iliac artery up to 1.9 cm, unchanged when measured at a similar level. Luminal evaluation and plaque assessment is precluded in the absence of contrast media. Few calcified nodes in the porta hepatis. No concerning adenopathy. Reproductive: The prostate and seminal vesicles are unremarkable. Other: No abdominopelvic free fluid or free gas. No bowel containing hernias. Suspect prior left inguinal hernia repair with Prolene plug. Fat containing right inguinal hernia is noted. Musculoskeletal: Multilevel degenerative changes are present in the imaged portions of the spine. No acute osseous abnormality or suspicious osseous lesion. IMPRESSION: 1. Fat containing right inguinal hernia. 2. No other acute intra-abdominal process to explain the patient's abdominal pain. Specifically normal appendix. 3. Saccular and fusiform aneurysmal dilatation of the infrarenal abdominal aorta up to 4.7 cm, stable from prior. Recommend followup by abdomen and pelvis CTA in 6 months, and vascular surgery referral/consultation if not already obtained. This recommendation follows ACR consensus guidelines: White Paper of the ACR Incidental Findings Committee II on Vascular Findings. J Am Coll Radiol 2013; 10:789-794. Aortic aneurysm NOS (ICD10-I71.9) 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 5. Colonic diverticulosis without evidence of diverticulitis. 6. Aortic  Atherosclerosis (ICD10-I70.0) 7. Emphysema (ICD10-J43.9). Electronically Signed   By: Lovena Le M.D.   On: 04/01/2020 20:24    Procedures Procedures (including critical care time)  Medications Ordered in ED Medications - No data to display  ED Course  I have reviewed the triage vital signs and the nursing notes.  Pertinent labs & imaging results that were available during my care of the patient were reviewed by me and considered in my medical decision making (see chart for details).    MDM Rules/Calculators/A&P                      Patient is a 79 year old male who presents with periumbilical type abdominal pain.  His abdominal exam is nonconcerning.  CT scan shows no acute abnormality.  There is a stable appearing AAA without any free fluid that would be concerning for liquids.  He was not able to get IV contrast given his elevated creatinine.  He does have baseline kidney disease and his creatinine is just slightly higher than his prior values.  He follows up with nephrology regarding this.  He had a small fat-containing right inguinal hernia.  His wife states that he had a prior hernia repair about 2 years ago by his urologist, Dr. Thomasene Mohair.  She will mention it to Dr. Thomasene Mohair on her next visit.  He does not have any tenderness in this area or bowel in the hernia.  His labs are nonconcerning other than his elevated creatinine and his glucose was elevated.  He is comfortable here with and has not required any pain medication.  He was discharged home in good condition.  His wife was encouraged to have a follow-up visit with his PCP.  Return precautions were given. Final Clinical Impression(s) / ED Diagnoses Final diagnoses:  Periumbilical abdominal pain    Rx / DC Orders ED Discharge Orders    None       Malvin Johns, MD 04/01/20 2122

## 2020-04-01 NOTE — ED Triage Notes (Signed)
Pt c/o RLQ abd pain and distension that started this AM. Denies N/V/D.

## 2020-04-06 ENCOUNTER — Other Ambulatory Visit: Payer: Self-pay | Admitting: *Deleted

## 2020-04-06 DIAGNOSIS — I714 Abdominal aortic aneurysm, without rupture, unspecified: Secondary | ICD-10-CM

## 2020-04-10 ENCOUNTER — Other Ambulatory Visit: Payer: Self-pay

## 2020-04-10 ENCOUNTER — Ambulatory Visit: Payer: Medicare PPO | Admitting: Physician Assistant

## 2020-04-10 ENCOUNTER — Ambulatory Visit (HOSPITAL_COMMUNITY)
Admission: RE | Admit: 2020-04-10 | Discharge: 2020-04-10 | Disposition: A | Discharge status: Home / Self Care | Payer: Medicare PPO | Source: Ambulatory Visit | Attending: Vascular Surgery | Admitting: Vascular Surgery

## 2020-04-10 VITALS — BP 139/72 | HR 61 | Temp 97.3°F | Resp 16 | Ht 65.5 in | Wt 149.0 lb

## 2020-04-10 DIAGNOSIS — I714 Abdominal aortic aneurysm, without rupture, unspecified: Secondary | ICD-10-CM

## 2020-04-10 NOTE — Progress Notes (Signed)
HISTORY AND PHYSICAL     CC:  follow up. Requesting Provider:  Bernerd Limbo, MD  HPI: This is a 79 y.o. male who is here today for follow up for AAA and is pt of Dr. Donnetta Hutching.  He was last seen in our office in November 2020 and at that time his AAA measured 4.49cm.   He has been evaluated for many years with carotid duplex and this always showed some right ECA stenosis but no evidence of ICA stenosis and at the last visit with Dr. Donnetta Hutching, it was decided to discontinue carotid duplex surveillance.   He was recently seen in the ER on 04/01/2020 for abdominal pain.  Per ER MD, physical exam was non concerning and CT scan showed no abnormality.  There was no free fluid that would be concerning.  He did not receive contrast due to renal function.   His abdominal pain did improve.    The pt returns today for follow up and he is here today with his wife (retirred Therapist, sports) of 80 years.  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  He denies any more abdominal pain.  He does not have any claudication of either leg.  He does have a wound on the top of the left foot that they think he scratched but this is improving.  Otherwise, no non healing wounds.    Since his last visit, he has been diagnosed with osteoporosis and prostate cancer for which he is taking injections.    The pt is on a statin for cholesterol management.    The pt is on an aspirin.    Other AC:  none The pt is on CCB, BB for hypertension.  The pt does not have diabetes. Tobacco hx:  Former-quit 2009  Pt does not have family hx of AAA.  Past Medical History:  Diagnosis Date  . AAA (abdominal aortic aneurysm) ([place])   . Abdominal aortic aneurysm (AAA) 3.0 cm to 5.5 cm in diameter in male [facility])   . Cancer [facility])    prostate cancer grade 7   . Carotid artery occlusion   . Chronic kidney disease   . Coronary artery disease   . Dementia ([place])   . GERD (gastroesophageal reflux disease)   .  Hyperlipidemia   . Hypertension   . Osteoporosis   . Peripheral vascular disease ([place])   . S/P colonoscopy 01/12/12  . Stroke [facility])     Past Surgical History:  Procedure Laterality Date  . CARDIAC CATHETERIZATION  05/2015  . CORONARY ARTERY BYPASS GRAFT  2009   4 Vessel  . MASTOIDECTOMY    . PROSTATE SURGERY      Allergies  Allergen Reactions  . Codeine Itching and Other (See Comments)    nausea nausea nausea Other reaction(s): Itching nausea nausea nausea   . Nitroglycerin Other (See Comments)    Migraine Headach Other reaction(s): Other Causes severe headaches Other reaction(s): Other Causes severe headaches Causes severe headaches Causes severe headaches   . Ace Inhibitors Other (See Comments)    hyponatremia hyponatremia hyponatremia Other reaction(s): Other hyponatremia Other reaction(s): Other (See Comments) hyponatremia Other reaction(s): Other hyponatremia hyponatremia hyponatremia   . Hydrochlorothiazide Other (See Comments)    hyponatremia hyponatremia hyponatremia Other reaction(s): Other hyponatremia Other reaction(s): Other hyponatremia hyponatremia hyponatremia     Current Outpatient Medications  Medication Sig Dispense Refill  . allopurinol (ZYLOPRIM) 100 MG tablet Take 100 mg by mouth daily.    Marland Kitchen  amLODipine (NORVASC) 5 MG tablet Take 1 tablet (5 mg total) by mouth daily. (Patient taking differently: Take 10 mg by mouth daily. ) 30 tablet 0  . aspirin EC 81 MG tablet Take 81 mg by mouth every other day.    Marland Kitchen atorvastatin (LIPITOR) 80 MG tablet Take 80 mg by mouth daily.    Marland Kitchen donepezil (ARICEPT) 10 MG tablet Take 10 mg by mouth every morning.     . febuxostat (ULORIC) 40 MG tablet Take 40 mg by mouth daily.     . fluticasone furoate-vilanterol (BREO ELLIPTA) 100-25 MCG/INH AEPB Inhale 1 puff into the lungs daily.    Marland Kitchen KRILL OIL PO Take by mouth daily.    . memantine (NAMENDA XR) 28 MG CP24 24 hr capsule Take 1 capsule (28 mg total)  by mouth daily. 30 capsule   . metoprolol succinate (TOPROL-XL) 25 MG 24 hr tablet Take 12.5 mg by mouth daily.     Marland Kitchen omeprazole (PRILOSEC) 40 MG capsule Take 1 capsule (40 mg total) by mouth daily. 30 capsule 0  . Probiotic Product (PROBIOTIC & ACIDOPHILUS EX ST PO) Take 1 tablet by mouth daily.     . sodium bicarbonate 650 MG tablet Take 1 tablet (650 mg total) by mouth daily.    . solifenacin (VESICARE) 10 MG tablet Take 10 mg by mouth at bedtime.     . Solifenacin Succinate (VESICARE PO) Take by mouth.    . Tamsulosin HCl (FLOMAX) 0.4 MG CAPS Take 0.4 mg by mouth daily.     No current facility-administered medications for this visit.    Family History  Problem Relation Age of Onset  . Coronary artery disease Mother     Social History   Socioeconomic History  . Marital status: Married    Spouse name: Not on file  . Number of children: Not on file  . Years of education: Not on file  . Highest education level: Not on file  Occupational History  . Not on file  Tobacco Use  . Smoking status: Former Smoker    Quit date: 06/27/2008    Years since quitting: 11.7  . Smokeless tobacco: Never Used  Vaping Use  . Vaping Use: Never used  Substance and Sexual Activity  . Alcohol use: Not Currently    Alcohol/week: 7.0 standard drinks    Types: 7 Cans of beer per week  . Drug use: No  . Sexual activity: Never  Other Topics Concern  . Not on file  Social History Narrative  . Not on file   Social Determinants of Health   Financial Resource Strain:   . Difficulty of Paying Living Expenses:   Food Insecurity:   . Worried About Charity fundraiser in the Last Year:   . Arboriculturist in the Last Year:   Transportation Needs:   . Film/video editor (Medical):   Marland Kitchen Lack of Transportation (Non-Medical):   Physical Activity:   . Days of Exercise per Week:   . Minutes of Exercise per Session:   Stress:   . Feeling of Stress :   Social Connections:   . Frequency of  Communication with Friends and Family:   . Frequency of Social Gatherings with Friends and Family:   . Attends Religious Services:   . Active Member of Clubs or Organizations:   . Attends Archivist Meetings:   Marland Kitchen Marital Status:   Intimate Partner Violence:   . Fear of Current or Ex-Partner:   .  Emotionally Abused:   Marland Kitchen Physically Abused:   . Sexually Abused:      REVIEW OF SYSTEMS:   [X]  denotes positive finding, [ ]  denotes negative finding Cardiac  Comments:  Chest pain or chest pressure: x   Shortness of breath upon exertion: x   Short of breath when lying flat:    Irregular heart rhythm:        Vascular    Pain in calf, thigh, or hip brought on by ambulation:    Pain in feet at night that wakes you up from your sleep:     Blood clot in your veins:    Leg swelling:         Pulmonary    Oxygen at home: x   Productive cough:     Wheezing:         Neurologic    Sudden weakness in arms or legs:     Sudden numbness in arms or legs:     Sudden onset of difficulty speaking or slurred speech:    Temporary loss of vision in one eye:     Problems with dizziness:         Gastrointestinal    Blood in stool:     Vomited blood:         Genitourinary    Burning when urinating:     Blood in urine:        Psychiatric    Major depression:         Hematologic    Bleeding problems:    Problems with blood clotting too easily:        Skin    Rashes or ulcers:        Constitutional    Fever or chills:      PHYSICAL EXAMINATION:  Today's Vitals   04/10/20 0823  BP: 139/72  Pulse: 61  Resp: 16  Temp: (!) 97.3 F (36.3 C)  TempSrc: Temporal  SpO2: 99%  Weight: 149 lb (67.6 kg)  Height: 5' 5.5" (1.664 m)   Body mass index is 24.42 kg/m.   General:  WDWN in NAD; vital signs documented above Gait: Not observed HENT: WNL, normocephalic Pulmonary: normal non-labored breathing , without wheezing Cardiac: regular HR, without  Murmur; without carotid  bruits Abdomen: soft, NT, no masses; aorta is palpable Skin: without rashes Vascular Exam/Pulses:  Right Left  Radial 2+ (normal) 2+ (normal)  Ulnar Unable to palpate  Unable to palpate   Femoral 2+ (normal) 2+ (normal)  Popliteal Unable to palpate  Unable to palpate   DP 2+ (normal) 2+ (normal)  PT 1+ (weak) Unable to palpate    Extremities: without ischemic changes, without Gangrene , without cellulitis; without open wounds;  Musculoskeletal: no muscle wasting or atrophy  Neurologic: A&O X 3;  No focal weakness or paresthesias are detected Psychiatric:  The pt has Normal affect.   Non-Invasive Vascular Imaging:    AAA Arterial duplex on 04/10/2020: Abdominal Aorta Findings:  +-----------+-------+----------+----------+--------+--------+--------+  Location  AP (cm)Trans (cm)PSV (cm/s)WaveformThrombusComments  +-----------+-------+----------+----------+--------+--------+--------+  Proximal  1.83  1.83   72                  +-----------+-------+----------+----------+--------+--------+--------+  Mid    3.01  2.89   71            tortuous  +-----------+-------+----------+----------+--------+--------+--------+  Distal   4.44  4.51   52            tortuous  +-----------+-------+----------+----------+--------+--------+--------+  RT  CIA Prox0.8  0.9    454                  +-----------+-------+----------+----------+--------+--------+--------+  LT CIA Prox1.1  1.4    232                  +-----------+-------+----------+----------+--------+--------+--------+   Visualization of the Right CIA Proximal artery and Left CIA Proximal  artery was limited.   Summary:  Abdominal Aorta: There is evidence of abnormal dilatation of the distal  Abdominal aorta. The largest aortic measurement is 4.5 cm. The largest aortic diameter remains essentially  unchanged compared to prior exam.  Previous diameter measurement was 4.5 cm  obtained on 09/10/2019.  Stenosis: +------------------+-------------+  Location     Stenosis     +------------------+-------------+  Right Common Iliac>50% stenosis  +------------------+-------------+  Left Common Iliac >50% stenosis  +------------------+-------------+    Previous AAA arterial duplex on 09/10/2019: Abdominal Aorta Findings:  +-----------+-------+----------+----------+--------+--------+--------+  Location  AP (cm)Trans (cm)PSV (cm/s)WaveformThrombusComments  +-----------+-------+----------+----------+--------+--------+--------+  Proximal  2.44  2.76   43                  +-----------+-------+----------+----------+--------+--------+--------+  Mid    3.93  3.20   96                  +-----------+-------+----------+----------+--------+--------+--------+  Distal   4.49  4.42   26                  +-----------+-------+----------+----------+--------+--------+--------+  RT CIA Prox0.7  -     209                  +-----------+-------+----------+----------+--------+--------+--------+  LT CIA Prox0.8  0.9    447            tortuous  +-----------+-------+----------+----------+--------+--------+--------+   Summary:  Abdominal Aorta: Lobulated aortic aneurysm with the largest aortic  diameter measuring 4.49 cms. Prior diamater 4.10 x 4.25 cm.  Carotid duplex 03/2017 Less than 40% bilateral ICA stenosis with right ECA stenosis   ASSESSMENT/PLAN:: 79 y.o. male here for follow up for AAA  -pt doing well and AAA remains relatively unchanged from last visit.  He was in the ER recently with abdominal pain and CT scan revealed no abnormality and his abdominal pain did resolve.   -today's duplex reveals >50% bilateral CIA stenosis-pt does not  have any claudication sx or non healing wounds and he does have palpable pedal pulses.   -given the size of the AAA, will have him return in one year with repeat duplex.  Discussed symptoms with pt and he and his wife know to call 911 should he develop severe sudden onset of abdominal or back pain. -in 2018, pt carotid duplex revealed < 40% bilateral ICA stenosis and decision was made with Dr. Donnetta Hutching to discontinue surveillance.   -continue statin/asa -He will call us sooner should he have any issues.    Leontine Locket, [facility] [phone]  Clinic MD:   Trula Slade

## 2020-07-26 DIAGNOSIS — Z79899 Other long term (current) drug therapy: Secondary | ICD-10-CM | POA: Insufficient documentation

## 2020-07-26 DIAGNOSIS — D84821 Immunodeficiency due to drugs: Secondary | ICD-10-CM | POA: Insufficient documentation

## 2020-12-25 IMAGING — CT CT ABD-PELV W/O CM
2 of 4 series · 15 of 46 positions shown, 17 images · non-contrast
Comparison: CT angiography 08/15/2018

CLINICAL DATA: Abdominal pain

EXAM:
CT ABDOMEN AND PELVIS WITHOUT CONTRAST
TECHNIQUE: Multidetector CT imaging of the abdomen and pelvis was performed
following the standard protocol without IV contrast.

[Series 2: axial st · axial · 0.80mm/px · z∈[+33,+378]mm · 12 of 80 slices shown, 14 images]
[im 7/80  soft-tissue]
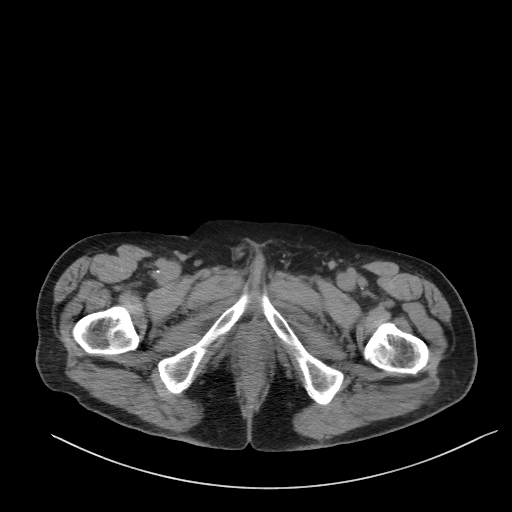
[im 7/80  bone]
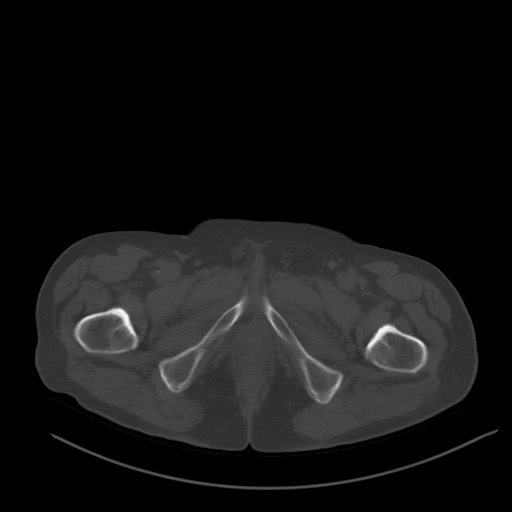
[im 13/80  soft-tissue]
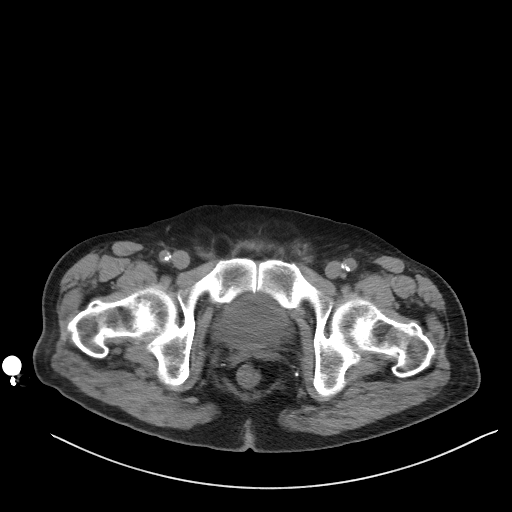
[im 19/80  soft-tissue]
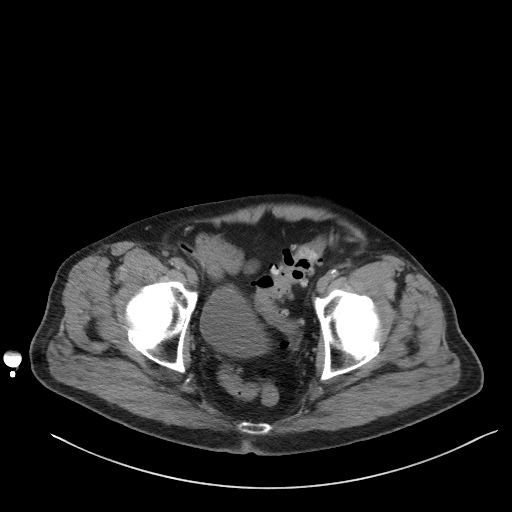
[im 26/80  soft-tissue]
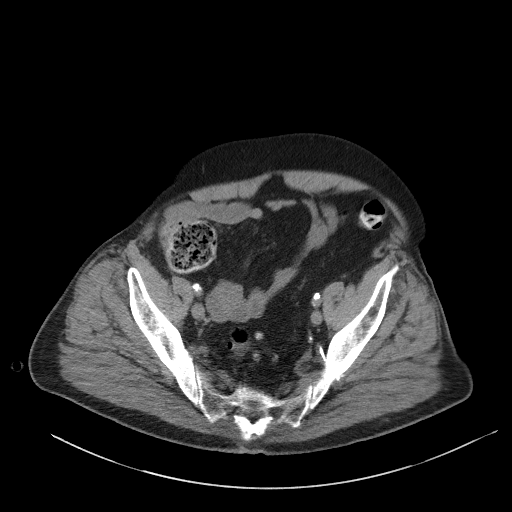
[im 32/80  soft-tissue]
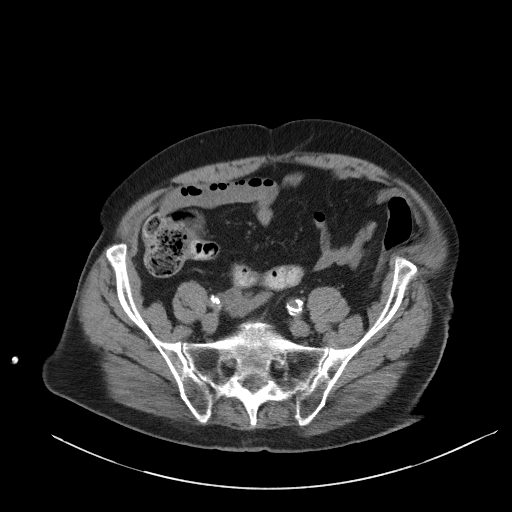
[im 38/80  soft-tissue]
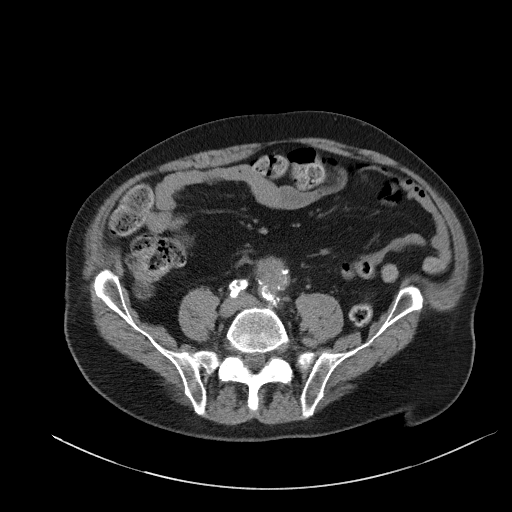
[im 45/80  soft-tissue]
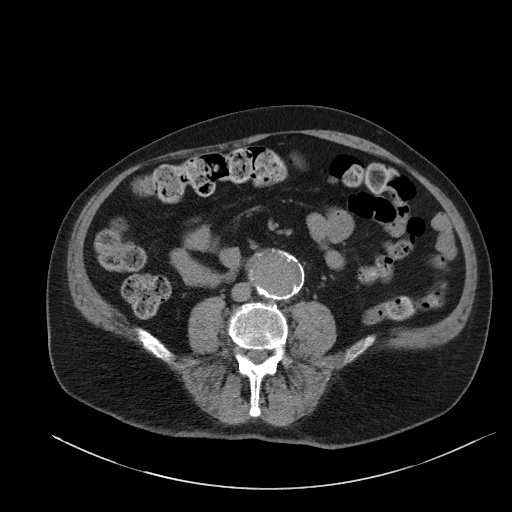
[im 51/80  soft-tissue]
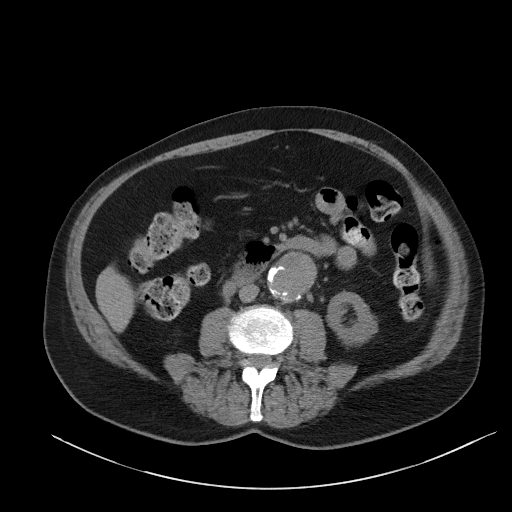
[im 57/80  soft-tissue]
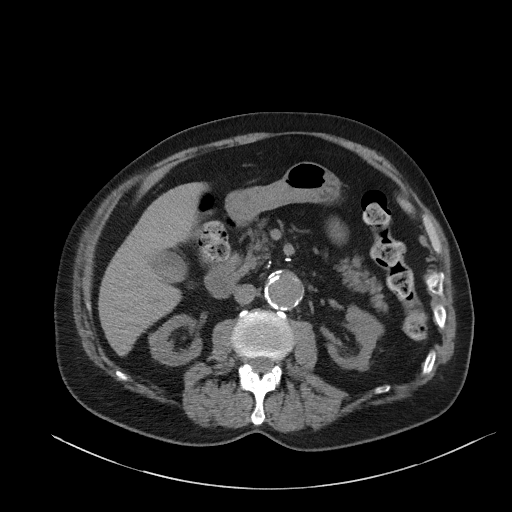
[im 57/80  bone]
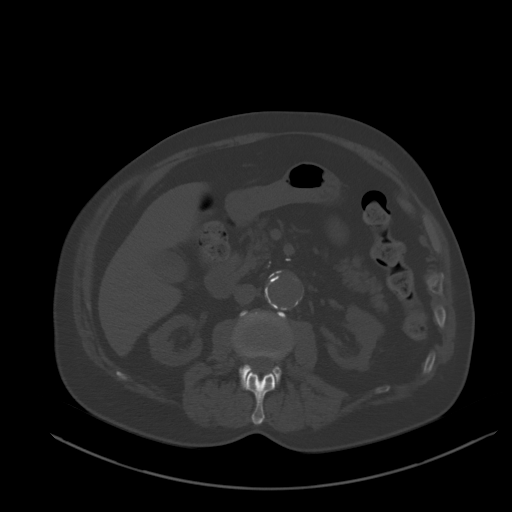
[im 64/80  soft-tissue]
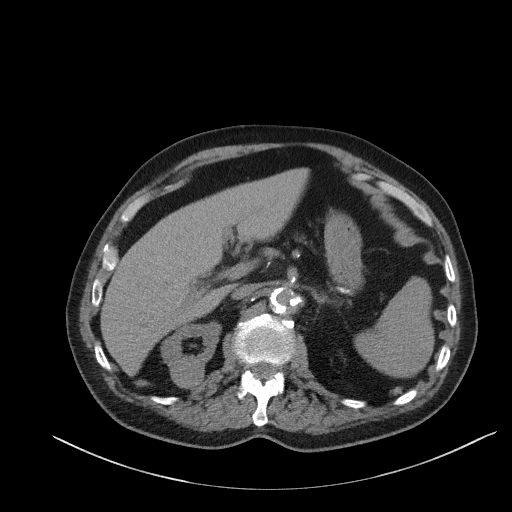
[im 70/80  soft-tissue]
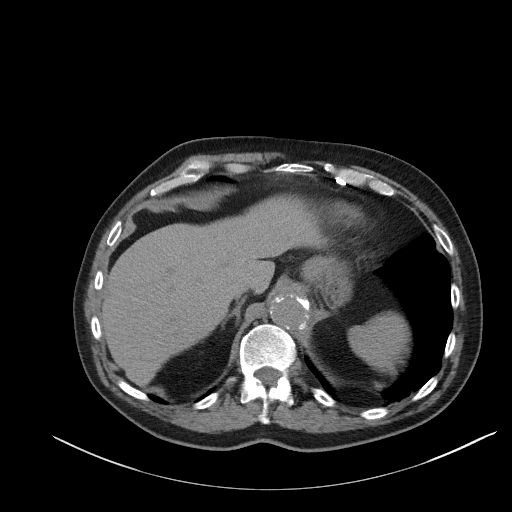
[im 76/80  soft-tissue]
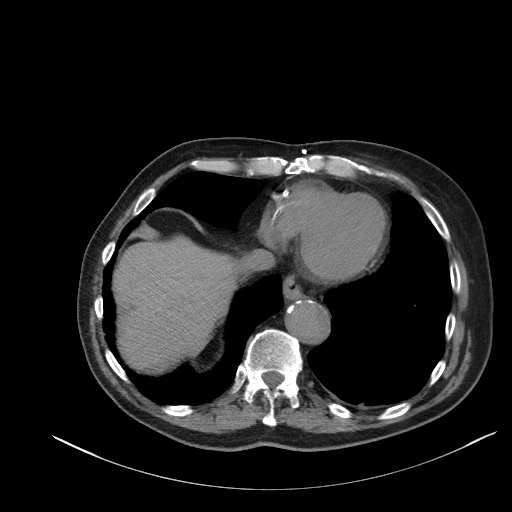

[Series 5: coronal st · coronal · 0.76mm/px · 3 of 92 slices shown]
[im 31/92  soft-tissue]
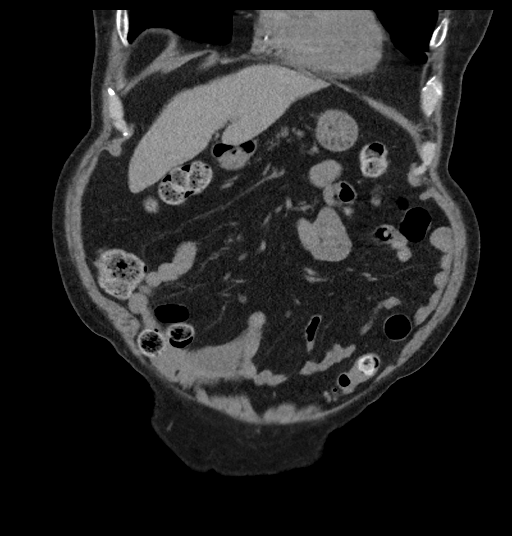
[im 41/92  soft-tissue]
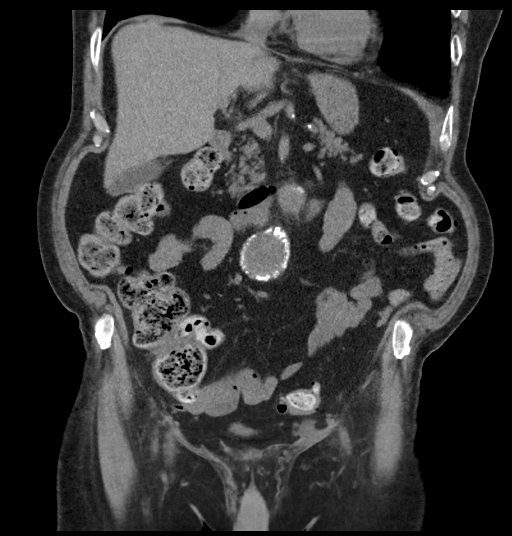
[im 51/92  soft-tissue]
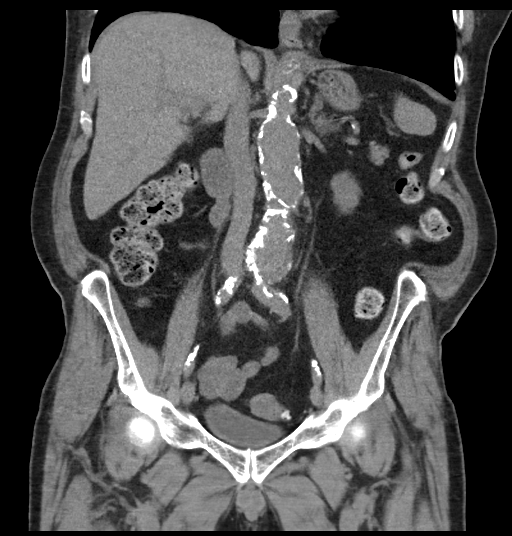

[15 of 46 positions shown; findings below may reference images not displayed]

FINDINGS: Lower chest: Emphysematous changes and scarring again noted in the
lung bases, right greater than left. No consolidative opacity or
effusion. Coronary artery calcifications are present. Normal cardiac
size. No pericardial effusion. Evidence of prior sternotomy and
likely CABG.

Hepatobiliary: No focal liver abnormality is seen. Dependently
layering attenuation in the gallbladder may reflect biliary sludge.
No calcified gallstones, gallbladder wall thickening, or biliary
dilatation.

Pancreas: Partial fatty replacement of the pancreas. No visible
concerning lesion. No inflammation or ductal dilatation.

Spleen: Normal in size without focal abnormality.

Adrenals/Urinary Tract: Normal adrenal glands. Multiple cysts again
seen throughout both kidneys including an intermediate attenuation
(36 HU) 2.5 cm cyst in the posterior right upper pole which measured
similar attenuation on prior angiography compatible with a
proteinaceous/hemorrhagic cyst. Additional subcentimeter
hypoattenuating foci in both kidneys too small to fully characterize
on CT imaging but statistically likely benign. No new or concerning
renal mass. No urolithiasis or hydronephrosis.

Stomach/Bowel: Distal esophagus and stomach are unremarkable.
Air-filled duodenal diverticulum about the third portion of the
duodenum ([DATE]). No inflammation. Duodenum is otherwise
unremarkable. No small bowel thickening or dilatation. High
attenuation material seen in the distal small bowel and colon, may
reflect ingested material. Partially air-filled, noninflamed
appendix present in the right lower quadrant. Scattered colonic
diverticula without focal pericolonic inflammation to suggest
diverticulitis. No colonic dilatation or wall thickening. No
evidence of bowel obstruction.

Vascular/Lymphatic: Extensive atherosclerotic calcification
throughout the abdominal aorta and branch vessels. Somewhat saccular
outpouching of the infrarenal abdominal aorta present superiorly
with a larger fusiform infrarenal aneurysm extending to the
bifurcation more inferiorly. Maximum diameter of the superior sac
measures 3.9 cm, stable. Maximum diameter of the fusiform infrarenal
aneurysm measures up to 4.7 cm, stable. Fusiform ectasia of the left
common iliac artery up to 1.9 cm, unchanged when measured at a
similar level. Luminal evaluation and plaque assessment is precluded
in the absence of contrast media. Few calcified nodes in the porta
hepatis. No concerning adenopathy.

Reproductive: The prostate and seminal vesicles are unremarkable.

Other: No abdominopelvic free fluid or free gas. No bowel containing
hernias. Suspect prior left inguinal hernia repair with Prolene
plug. Fat containing right inguinal hernia is noted.

Musculoskeletal: Multilevel degenerative changes are present in the
imaged portions of the spine. No acute osseous abnormality or
suspicious osseous lesion.
IMPRESSION: 1. Fat containing right inguinal hernia.
2. No other acute intra-abdominal process to explain the patient's
abdominal pain. Specifically normal appendix.
3. Saccular and fusiform aneurysmal dilatation of the infrarenal
abdominal aorta up to 4.7 cm, stable from prior. Recommend followup
by abdomen and pelvis CTA in 6 months, and vascular surgery
referral/consultation if not already obtained. This recommendation
follows ACR consensus guidelines: White Paper of the ACR Incidental
Findings Committee II on Vascular Findings. [HOSPITAL] 9371;
[DATE]. Aortic aneurysm NOS (48Q64-KKW.4)
4. Left common iliac artery aneurysm up to 1.9 cm, also unchanged.
5. Colonic diverticulosis without evidence of diverticulitis.
6. Aortic Atherosclerosis (48Q64-NZ4.4)
7. Emphysema (48Q64-NQ9.M).

## 2021-02-06 DIAGNOSIS — C61 Malignant neoplasm of prostate: Secondary | ICD-10-CM | POA: Insufficient documentation

## 2021-03-13 ENCOUNTER — Other Ambulatory Visit: Payer: Self-pay

## 2021-03-13 DIAGNOSIS — I714 Abdominal aortic aneurysm, without rupture, unspecified: Secondary | ICD-10-CM

## 2021-03-25 DIAGNOSIS — I5032 Chronic diastolic (congestive) heart failure: Secondary | ICD-10-CM | POA: Insufficient documentation

## 2021-04-16 NOTE — Progress Notes (Signed)
[facility] [place]  ASSESSMENT / PLAN: Juan Montoya is a 80 y.o. male with a infrarenal abdominal aortic aneurysm measuring 23mm in greatest orthogonal dimension.  A statement from the [place] [facility] and Society for Vascular Surgery estimated the annual rupture risk according to AAA diameter to be the following: 4.0 cm to 4.9 cm in diameter - 0.5% to 5%  The patient is not yet a candidate for elective repair of the aneurysm to prevent rupture.  I explained the risks / benefits / alternatives to different approaches to aortic reconstruction.   Recommend the following to reduce the risk of major adverse cardiac / limb events.  Complete cessation from all tobacco products. Blood glucose control with goal A1c < 7%. Blood pressure control with goal blood pressure < 140/90 mmHg. Lipid reduction therapy with goal LDL-C <100 mg/dL. Aspirin 81mg  PO QD.  Atorvastatin 40-80mg  PO QD (or other "high intensity" statin therapy).  Follow up in 6 months with PA. Check CTA of A/P if aneurysm is near 5.5cm. Otherwise will continue with biannual surveillance.  CHIEF COMPLAINT: aneurysm surveillance  HISTORY OF PRESENT ILLNESS: Juan Montoya is a 80 y.o. male who returns to our office for continued surveillance of known abdominal aortic aneurysm.  He had a recent admission to the hospital for congestive heart failure.  His wife is a Equities trader and has been taking excellent care of him.  He has no symptoms attributable to his abdominal aortic aneurysm.  He denies abdominal pain.  No symptoms of peripheral arterial disease.  No claudication, rest pain, or ischemic ulceration.  Past Medical History:  Diagnosis Date   AAA (abdominal aortic aneurysm) ([place])    Abdominal aortic aneurysm (AAA) 3.0 cm to 5.5 cm in diameter in male [facility])    Cancer (HCC)    prostate cancer grade 7    Carotid artery occlusion    Chronic kidney  disease    Coronary artery disease    Dementia (HCC)    GERD (gastroesophageal reflux disease)    Hyperlipidemia    Hypertension    Osteoporosis    Peripheral vascular disease (HCC)    S/P colonoscopy 01/12/12   Stroke [facility])     Past Surgical History:  Procedure Laterality Date   CARDIAC CATHETERIZATION  05/2015   CORONARY ARTERY BYPASS GRAFT  2009   4 Vessel   MASTOIDECTOMY     PROSTATE SURGERY      Family History  Problem Relation Age of Onset   Coronary artery disease Mother     Social History   Socioeconomic History   Marital status: Married    Spouse name: Not on file   Number of children: Not on file   Years of education: Not on file   Highest education level: Not on file  Occupational History   Not on file  Tobacco Use   Smoking status: Former    Pack years: 0.00    Types: Cigarettes    Quit date: 06/27/2008    Years since quitting: 12.8   Smokeless tobacco: Never  Vaping Use   Vaping Use: Never used  Substance and Sexual Activity   Alcohol use: Not Currently    Alcohol/week: 7.0 standard drinks    Types: 7 Cans of beer per week   Drug use: No   Sexual activity: Never  Other Topics Concern   Not on file  Social History Narrative   Not on file  Social Determinants of Health   Financial Resource Strain: Not on file  Food Insecurity: Not on file  Transportation Needs: Not on file  Physical Activity: Not on file  Stress: Not on file  Social Connections: Not on file  Intimate Partner Violence: Not on file    Allergies  Allergen Reactions   Codeine Itching and Other (See Comments)    nausea nausea nausea Other reaction(s): Itching nausea nausea nausea    Nitroglycerin Other (See Comments)    Migraine Headach Other reaction(s): Other Causes severe headaches Other reaction(s): Other Causes severe headaches Causes severe headaches Causes severe headaches    Ace Inhibitors Other (See Comments)     hyponatremia hyponatremia hyponatremia Other reaction(s): Other hyponatremia Other reaction(s): Other (See Comments) hyponatremia Other reaction(s): Other hyponatremia hyponatremia hyponatremia    Hydrochlorothiazide Other (See Comments)    hyponatremia hyponatremia hyponatremia Other reaction(s): Other hyponatremia Other reaction(s): Other hyponatremia hyponatremia hyponatremia     Current Outpatient Medications  Medication Sig Dispense Refill   albuterol (VENTOLIN HFA) 108 (90 Base) MCG/ACT inhaler Inhale into the lungs.     allopurinol (ZYLOPRIM) 100 MG tablet Take 100 mg by mouth daily.     amLODipine (NORVASC) 10 MG tablet Take 1 tablet by mouth daily.     aspirin EC 81 MG tablet Take 81 mg by mouth every other day.     atorvastatin (LIPITOR) 80 MG tablet Take 80 mg by mouth daily.     carvedilol (COREG) 6.25 MG tablet Take by mouth.     cloNIDine (CATAPRES) 0.1 MG tablet Take by mouth.     denosumab (PROLIA) 60 MG/ML SOSY injection Inject into the skin.     donepezil (ARICEPT) 10 MG tablet Take 10 mg by mouth every morning.      furosemide (LASIX) 40 MG tablet Take by mouth.     memantine (NAMENDA XR) 28 MG CP24 24 hr capsule Take 1 capsule (28 mg total) by mouth daily. 30 capsule    Multiple Vitamin (QUINTABS) TABS Take 1 tablet by mouth daily.     omeprazole (PRILOSEC) 40 MG capsule Take 1 capsule (40 mg total) by mouth daily. 30 capsule 0   Probiotic Product (PROBIOTIC & ACIDOPHILUS EX ST PO) Take 1 tablet by mouth daily.      sodium bicarbonate 650 MG tablet Take 1 tablet (650 mg total) by mouth daily.     solifenacin (VESICARE) 10 MG tablet Take 10 mg by mouth at bedtime.      Solifenacin Succinate (VESICARE PO) Take by mouth.     Tiotropium Bromide-Olodaterol (STIOLTO RESPIMAT) 2.5-2.5 MCG/ACT AERS Inhale 1 puff into the lungs daily.     Triptorelin Pamoate (TRELSTAR MIXJECT) 3.75 MG injection Inject into the muscle.     No current facility-administered  medications for this visit.    REVIEW OF SYSTEMS:  [X]  denotes positive finding, [ ]  denotes negative finding Cardiac  Comments:  Chest pain or chest pressure: x   Shortness of breath upon exertion: x   Short of breath when lying flat:    Irregular heart rhythm:        Vascular    Pain in calf, thigh, or hip brought on by ambulation:    Pain in feet at night that wakes you up from your sleep:     Blood clot in your veins:    Leg swelling:         Pulmonary    Oxygen at home: x   Productive cough:  Wheezing:  x       Neurologic    Sudden weakness in arms or legs:     Sudden numbness in arms or legs:     Sudden onset of difficulty speaking or slurred speech:    Temporary loss of vision in one eye:     Problems with dizziness:         Gastrointestinal    Blood in stool:     Vomited blood:         Genitourinary    Burning when urinating:     Blood in urine:        Psychiatric    Major depression:         Hematologic    Bleeding problems:    Problems with blood clotting too easily:        Skin    Rashes or ulcers:        Constitutional    Fever or chills:      PHYSICAL EXAM Vitals:   04/17/21 0936  BP: (!) 172/74  Pulse: (!) 57  Resp: 20  Temp: 98 F (36.7 C)  SpO2: 94%  Weight: 149 lb (67.6 kg)  Height: 5' 5.5" (1.664 m)    Constitutional: Elderly, but appears stated age. No distress. Appears well nourished.  Neurologic: CN intact. no focal findings. no sensory loss. Psychiatric: Mood and affect symmetric and appropriate. Eyes: No icterus. No conjunctival pallor. Ears, nose, throat: mucous membranes moist. Midline trachea.  Cardiac: rate and rhythm.  Respiratory: unlabored. Abdominal: soft, non-tender, non-distended.  Peripheral vascular: 2+ DP pulses bilaterally; 2+ radial pulses Extremity: no edema. no cyanosis. no pallor.  Skin: no gangrene. no ulceration.  Lymphatic: no Stemmer's sign. no palpable lymphadenopathy.  PERTINENT LABORATORY  AND RADIOLOGIC DATA  Most recent CBC CBC Latest Ref Rng & Units 04/01/2020 08/14/2018 07/08/2017  WBC 4.0 - 10.5 K/uL 11.8(H) 9.3 10.0  Hemoglobin 13.0 - 17.0 g/dL 14.7 15.3 14.7  Hematocrit 39.0 - 52.0 % 45.1 47.6 44.8  Platelets 150 - 400 K/uL 351 322 313     Most recent CMP CMP Latest Ref Rng & Units 04/01/2020 08/14/2018 07/08/2017  Glucose 70 - 99 mg/dL 230(H) 148(H) 138(H)  BUN 8 - 23 mg/dL 28(H) 25(H) 20  Creatinine 0.61 - 1.24 mg/dL 1.91(H) 1.77(H) 1.80(H)  Sodium 135 - 145 mmol/L 134(L) 141 140  Potassium 3.5 - 5.1 mmol/L 3.8 3.6 3.9  Chloride 98 - 111 mmol/L 99 104 104  CO2 22 - 32 mmol/L 25 27 26   Calcium 8.9 - 10.3 mg/dL 9.2 9.4 8.7(L)  Total Protein 6.5 - 8.1 g/dL 7.5 - 7.5  Total Bilirubin 0.3 - 1.2 mg/dL 0.7 - 0.7  Alkaline Phos 38 - 126 U/L 96 - 99  AST 15 - 41 U/L 25 - 36  ALT 0 - 44 U/L 18 - 30    Renal function CrCl cannot be calculated (Patient's most recent lab result is older than the maximum 21 days allowed.).  Hgb A1c MFr Bld (%)  Date Value  06/26/2017 6.4 (H)    LDL Cholesterol  Date Value Ref Range Status  06/26/2017 61 0 - 99 mg/dL Final    Comment:           Total Cholesterol/HDL:CHD Risk Coronary Heart Disease Risk Table                     Men   Women  1/2 Average Risk   3.4   3.3  Average Risk       5.0   4.4  2 X Average Risk   9.6   7.1  3 X Average Risk  23.4   11.0        Use the calculated Patient Ratio above and the CHD Risk Table to determine the patient's CHD Risk.        ATP III CLASSIFICATION (LDL):  <100     mg/dL   Optimal  100-129  mg/dL   Near or Above                    Optimal  130-159  mg/dL   Borderline  160-189  mg/dL   High  >190     mg/dL   Very High      Vascular Imaging: AAA duplex 04/17/21 Interval increase in aneurysm sac to 4.9cm.  Yevonne Aline. Stanford Breed, MD [facility] Phone Number: [phone] 04/17/2021 10:16 AM

## 2021-04-17 ENCOUNTER — Other Ambulatory Visit: Payer: Self-pay

## 2021-04-17 ENCOUNTER — Ambulatory Visit: Payer: Medicare PPO | Admitting: Vascular Surgery

## 2021-04-17 ENCOUNTER — Encounter: Payer: Self-pay | Admitting: Vascular Surgery

## 2021-04-17 ENCOUNTER — Ambulatory Visit (HOSPITAL_COMMUNITY)
Admission: RE | Admit: 2021-04-17 | Discharge: 2021-04-17 | Disposition: A | Discharge status: Home / Self Care | Payer: Medicare PPO | Source: Ambulatory Visit | Attending: Vascular Surgery | Admitting: Vascular Surgery

## 2021-04-17 VITALS — BP 172/74 | HR 57 | Temp 98.0°F | Resp 20 | Ht 65.5 in | Wt 149.0 lb

## 2021-04-17 DIAGNOSIS — I714 Abdominal aortic aneurysm, without rupture, unspecified: Secondary | ICD-10-CM

## 2021-04-18 ENCOUNTER — Other Ambulatory Visit: Payer: Self-pay

## 2021-04-18 DIAGNOSIS — I714 Abdominal aortic aneurysm, without rupture, unspecified: Secondary | ICD-10-CM

## 2021-11-12 ENCOUNTER — Ambulatory Visit: Payer: Medicare PPO

## 2021-11-12 ENCOUNTER — Other Ambulatory Visit (HOSPITAL_COMMUNITY): Payer: Medicare PPO

## 2021-12-03 ENCOUNTER — Ambulatory Visit: Payer: Medicare PPO

## 2021-12-03 ENCOUNTER — Other Ambulatory Visit (HOSPITAL_COMMUNITY): Payer: Medicare PPO

## 2021-12-11 ENCOUNTER — Ambulatory Visit: Payer: Medicare PPO | Admitting: Physician Assistant

## 2021-12-11 ENCOUNTER — Ambulatory Visit (HOSPITAL_COMMUNITY)
Admission: RE | Admit: 2021-12-11 | Discharge: 2021-12-11 | Disposition: A | Discharge status: Home / Self Care | Payer: Medicare PPO | Source: Ambulatory Visit | Attending: Vascular Surgery | Admitting: Vascular Surgery

## 2021-12-11 ENCOUNTER — Other Ambulatory Visit: Payer: Self-pay

## 2021-12-11 VITALS — BP 162/67 | HR 60 | Temp 97.6°F | Resp 18 | Ht 66.0 in | Wt 152.4 lb

## 2021-12-11 DIAGNOSIS — I7143 Infrarenal abdominal aortic aneurysm, without rupture: Secondary | ICD-10-CM

## 2021-12-11 DIAGNOSIS — I714 Abdominal aortic aneurysm, without rupture, unspecified: Secondary | ICD-10-CM | POA: Diagnosis present

## 2021-12-11 NOTE — Progress Notes (Signed)
Office Note     CC:  follow up Requesting Provider:  Bernerd Limbo, MD  HPI: Juan Montoya is a 81 y.o. (1941-09-23) male who presents for surveillance follow up for AAA. At his last visit in June the aneurysm was measuring 4.9 cm in its greatest dimension. He has been without symptoms.   He has no symptoms attributable to his abdominal aortic aneurysm.  He denies abdominal or back pain. No claudication, rest pain, or ischemic ulceration.His wife who is present with him today explains that he did fall at home recently onto his shoulder so he has been having shoulder soreness since. She did not take him to ER or his PCP as felt like it was just bruised. He has full range of motion in arm and there was no bruising or swelling. She also reports that he has been very unsteady on his feet, shuffling gait and struggles at times with stairs. She has had him follow up with neurologist who feels this is mostly secondary to aging.    The pt is on a statin for cholesterol management.  The pt is on a daily aspirin.   Other AC:  none The pt is on BB, CCB for hypertension.   The pt is not diabetic.   Tobacco hx:  Former, quit 2009  Past Medical History:  Diagnosis Date   AAA (abdominal aortic aneurysm)    Abdominal aortic aneurysm (AAA) 3.0 cm to 5.5 cm in diameter in male    Cancer [facility])    prostate cancer grade 7    Carotid artery occlusion    Chronic kidney disease    Coronary artery disease    Dementia (HCC)    GERD (gastroesophageal reflux disease)    Hyperlipidemia    Hypertension    Osteoporosis    Peripheral vascular disease (HCC)    S/P colonoscopy 01/12/12   Stroke [facility])     Past Surgical History:  Procedure Laterality Date   CARDIAC CATHETERIZATION  05/2015   CORONARY ARTERY BYPASS GRAFT  2009   4 Vessel   MASTOIDECTOMY     PROSTATE SURGERY      Social History   Socioeconomic History   Marital status: Married    Spouse name: Not on file   Number of children: Not on  file   Years of education: Not on file   Highest education level: Not on file  Occupational History   Not on file  Tobacco Use   Smoking status: Former    Types: Cigarettes    Quit date: 06/27/2008    Years since quitting: 13.4   Smokeless tobacco: Never  Vaping Use   Vaping Use: Never used  Substance and Sexual Activity   Alcohol use: Not Currently    Alcohol/week: 7.0 standard drinks    Types: 7 Cans of beer per week   Drug use: No   Sexual activity: Never  Other Topics Concern   Not on file  Social History Narrative   Not on file   Social Determinants of Health   Financial Resource Strain: Not on file  Food Insecurity: Not on file  Transportation Needs: Not on file  Physical Activity: Not on file  Stress: Not on file  Social Connections: Not on file  Intimate Partner Violence: Not on file    Family History  Problem Relation Age of Onset   Coronary artery disease Mother     Current Outpatient Medications  Medication Sig Dispense Refill   albuterol (VENTOLIN HFA)  108 (90 Base) MCG/ACT inhaler Inhale into the lungs.     allopurinol (ZYLOPRIM) 100 MG tablet Take 200 mg by mouth daily.     amLODipine (NORVASC) 10 MG tablet Take 1 tablet by mouth daily.     aspirin EC 81 MG tablet Take 81 mg by mouth every other day.     atorvastatin (LIPITOR) 80 MG tablet Take 80 mg by mouth daily.     carvedilol (COREG) 6.25 MG tablet Take by mouth.     cloNIDine (CATAPRES) 0.1 MG tablet Take by mouth.     denosumab (PROLIA) 60 MG/ML SOSY injection Inject into the skin.     donepezil (ARICEPT) 10 MG tablet Take 10 mg by mouth every morning.      furosemide (LASIX) 40 MG tablet Take by mouth.     memantine (NAMENDA XR) 28 MG CP24 24 hr capsule Take 1 capsule (28 mg total) by mouth daily. 30 capsule    Multiple Vitamin (QUINTABS) TABS Take 1 tablet by mouth daily.     omeprazole (PRILOSEC) 40 MG capsule Take 1 capsule (40 mg total) by mouth daily. 30 capsule 0   Probiotic Product  (PROBIOTIC & ACIDOPHILUS EX ST PO) Take 1 tablet by mouth daily.      sodium bicarbonate 650 MG tablet Take 1 tablet (650 mg total) by mouth daily.     solifenacin (VESICARE) 10 MG tablet Take 10 mg by mouth at bedtime.      Tiotropium Bromide-Olodaterol (STIOLTO RESPIMAT) 2.5-2.5 MCG/ACT AERS Inhale 1 puff into the lungs daily.     Triptorelin Pamoate (TRELSTAR MIXJECT) 3.75 MG injection Inject into the muscle.     Solifenacin Succinate (VESICARE PO) Take by mouth. (Patient not taking: Reported on 12/11/2021)     No current facility-administered medications for this visit.    Allergies  Allergen Reactions   Codeine Itching and Other (See Comments)    nausea nausea nausea Other reaction(s): Itching nausea nausea nausea    Nitroglycerin Other (See Comments)    Migraine Headach Other reaction(s): Other Causes severe headaches Other reaction(s): Other Causes severe headaches Causes severe headaches Causes severe headaches    Ace Inhibitors Other (See Comments)    hyponatremia hyponatremia hyponatremia Other reaction(s): Other hyponatremia Other reaction(s): Other (See Comments) hyponatremia Other reaction(s): Other hyponatremia hyponatremia hyponatremia    Hydrochlorothiazide Other (See Comments)    hyponatremia hyponatremia hyponatremia Other reaction(s): Other hyponatremia Other reaction(s): Other hyponatremia hyponatremia hyponatremia      REVIEW OF SYSTEMS:  [X]  denotes positive finding, [ ]  denotes negative finding Cardiac  Comments:  Chest pain or chest pressure:    Shortness of breath upon exertion:    Short of breath when lying flat:    Irregular heart rhythm:        Vascular    Pain in calf, thigh, or hip brought on by ambulation:    Pain in feet at night that wakes you up from your sleep:     Blood clot in your veins:    Leg swelling:         Pulmonary    Oxygen at home:    Productive cough:     Wheezing:         Neurologic    Sudden  weakness in arms or legs:     Sudden numbness in arms or legs:     Sudden onset of difficulty speaking or slurred speech:    Temporary loss of vision in one eye:  Problems with dizziness:         Gastrointestinal    Blood in stool:     Vomited blood:         Genitourinary    Burning when urinating:     Blood in urine:        Psychiatric    Major depression:         Hematologic    Bleeding problems:    Problems with blood clotting too easily:        Skin    Rashes or ulcers:        Constitutional    Fever or chills:      PHYSICAL EXAMINATION:  Vitals:   12/11/21 0833  BP: (!) 162/67  Pulse: 60  Resp: 18  Temp: 97.6 F (36.4 C)  TempSrc: Temporal  SpO2: 94%  Weight: 152 lb 6.4 oz (69.1 kg)  Height: 5\' 6"  (1.676 m)    General:  WDWN in NAD; vital signs documented above Gait: Normal HENT: WNL, normocephalic Pulmonary: normal non-labored breathing wheezing Cardiac: regular HR, without  Murmurs without carotid bruit Abdomen: soft, NT, no masses. No palpable AAA Vascular Exam/Pulses:  Right Left  Radial 2+ (normal) 2+ (normal)  Femoral 2+ (normal) 2+ (normal)  Popliteal Not palpable Not palpable  DP 2+ (normal) 2+ (normal)  PT 1+ (weak) 1+ (weak)   Extremities: without ischemic changes, without Gangrene , without cellulitis; without open wounds;  Musculoskeletal: no muscle wasting or atrophy  Neurologic: A&O X 3;  No focal weakness or paresthesias are detected Psychiatric:  The pt has Normal affect.   Non-Invasive Vascular Imaging:   Summary:  Abdominal Aorta: There is evidence of abnormal dilatation of the mid Abdominal aorta. The largest aortic measurement is 4.6 cm. The largest aortic diameter is unchanged compared to prior exam. Previous diameter measurement was 4.9 cm obtained on 04/17/2021.   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 male here for follow up for AAA. Duplex today shows minimal change from prior study 6 months ago with greatest diameter of 4.6 cm. He has no attributable symptoms.  - Continue Aspirin and statin - Discussed importance of adequate blood pressure control - Reviewed signs and symptoms of expansion/ rupture with patient and his wife and they understand to seek immediate medical attention should this occur - He will follow up in 6 months with repeat AAA duplex  Karoline Caldwell, PA-C [facility] [phone]  Clinic MD:   Roxanne Mins

## 2021-12-14 ENCOUNTER — Other Ambulatory Visit: Payer: Self-pay | Admitting: *Deleted

## 2021-12-14 DIAGNOSIS — I7143 Infrarenal abdominal aortic aneurysm, without rupture: Secondary | ICD-10-CM

## 2022-02-07 DIAGNOSIS — M810 Age-related osteoporosis without current pathological fracture: Secondary | ICD-10-CM | POA: Insufficient documentation

## 2022-02-13 ENCOUNTER — Ambulatory Visit: Payer: Medicare PPO | Attending: Family Medicine | Admitting: Physical Therapy

## 2022-02-13 ENCOUNTER — Encounter: Payer: Self-pay | Admitting: Physical Therapy

## 2022-02-13 DIAGNOSIS — R2681 Unsteadiness on feet: Secondary | ICD-10-CM

## 2022-02-13 DIAGNOSIS — M6281 Muscle weakness (generalized): Secondary | ICD-10-CM

## 2022-02-13 DIAGNOSIS — R262 Difficulty in walking, not elsewhere classified: Secondary | ICD-10-CM | POA: Diagnosis present

## 2022-02-13 DIAGNOSIS — R296 Repeated falls: Secondary | ICD-10-CM | POA: Diagnosis present

## 2022-02-13 NOTE — Therapy (Signed)
[place] [address] ?[place], [place], [address] ?Phone: [phone]   Fax:  [phone] ? ?Physical Therapy Evaluation ? ?Patient Details  ?Name: Juan Montoya ?MRN: [id number] ?Date of Birth: [birth year] ?Referring Provider (PT): Bernerd Limbo ? ? ?Encounter Date: 02/13/2022 ? ? PT End of Session - 02/13/22 1023   ? ? Visit Number 1   ? Number of Visits 17   ? Date for PT Re-Evaluation 04/10/22   ? Authorization Type [employer]   ? Authorization Time Period 02/13/22 to 04/10/22   ? Progress Note Due on Visit 10   ? PT Start Time [phone]   ? PT Stop Time 1011   ? PT Time Calculation (min) 38 min   ? Equipment Utilized During Treatment Gait belt   ? Activity Tolerance Patient tolerated treatment well   ? Behavior During Therapy [facility] for tasks assessed/performed   ? ?  ?  ? ?  ? ? ?Past Medical History:  ?Diagnosis Date  ? AAA (abdominal aortic aneurysm) ([place])   ? Abdominal aortic aneurysm (AAA) 3.0 cm to 5.5 cm in diameter in male [facility])   ? Cancer [facility])   ? prostate cancer grade 7   ? Carotid artery occlusion   ? Chronic kidney disease   ? Coronary artery disease   ? Dementia ([place])   ? GERD (gastroesophageal reflux disease)   ? Hyperlipidemia   ? Hypertension   ? Osteoporosis   ? Peripheral vascular disease ([place])   ? S/P colonoscopy 01/12/12  ? Stroke [facility])   ? ? ?Past Surgical History:  ?Procedure Laterality Date  ? CARDIAC CATHETERIZATION  05/2015  ? CORONARY ARTERY BYPASS GRAFT  2009  ? 4 Vessel  ? MASTOIDECTOMY    ? PROSTATE SURGERY    ? ? ?There were no vitals filed for this visit. ? ? ? Subjective Assessment - 02/13/22 0934   ? ? Subjective Having lots of issues with falls, had an almost very serious fall about a month ago where he missed hitting an iron pot with his head. 2 weeks ago he fell upstairs due to getting feet tangled up. Has a hard time getting up to standing from a sitting position. Wondering if water aerobics would help. Has very poor endurance and  has SOB with exertion as well. Has O2 at the house but rarely needs it. Has extremely poor memory, recall less than 2 seconds.   ? Patient is accompained by: Family member   ? Patient Stated Goals reduce falls, get stronger, get more active   ? Currently in Pain? No/denies   ? ?  ?  ? ?  ? ? ? ? ? [facility] PT Assessment - 02/13/22 0001   ? ?  ? Assessment  ? Medical Diagnosis falls   ? Referring Provider (PT) Bernerd Limbo   ? Onset Date/Surgical Date --   chronic  ? Next MD Visit Dr. Coletta Memos in 6 months or PRN   ? Prior Therapy none   ?  ? Precautions  ? Precautions Fall;Other (comment)   ? Precaution Comments HOH, blind L eye, needs O2 intermittently   ?  ? Restrictions  ? Weight Bearing Restrictions No   ?  ? Balance Screen  ? Has the patient fallen in the past 6 months Yes   ? How many times? 3-4   ? Has the patient had a decrease in activity level because of a fear of falling?  Yes   ?  Is the patient reluctant to leave their home because of a fear of falling?  No   ?  ? Home Environment  ? Living Environment Private residence   ?  ? Prior Function  ? Level of Independence Independent;Independent with basic ADLs;Independent with gait;Independent with transfers   ? Vocation Retired   ? Leisure going to [employer] baseball games, going for rides on weekends   ?  ? Observation/Other Assessments  ? Observations very poor short term memory- recall less than 2 seconds   ?  ? ROM / Strength  ? AROM / PROM / Strength Strength   ?  ? Strength  ? Strength Assessment Site Hip;Knee;Ankle   ? Right/Left Hip Right;Left   ? Right Hip Flexion 4/5   ? Right Hip Extension 3/5   ? Right Hip ABduction 3-/5   ? Left Hip Flexion 4/5   ? Left Hip Extension 3/5   ? Left Hip ABduction 3-/5   ? Right/Left Knee Right;Left   ? Right Knee Flexion 3+/5   ? Right Knee Extension 4/5   ? Left Knee Flexion 4+/5   ? Left Knee Extension 5/5   ? Right/Left Ankle Right;Left   ? Right Ankle Dorsiflexion 5/5   ? Left Ankle Dorsiflexion 5/5   ?  ?  Ambulation/Gait  ? Gait Comments shuffling gait pattern, tends to get very unsteady on corners   ?  ? 6 minute walk test results   ? Endurance additional comments [id number]   ?  ? Standardized Balance Assessment  ? Standardized Balance Assessment Berg Balance Test   ?  ? Berg Balance Test  ? Sit to Stand Able to stand  independently using hands   ? Standing Unsupported Able to stand safely 2 minutes   ? Sitting with Back Unsupported but Feet Supported on Floor or Stool Able to sit safely and securely 2 minutes   ? Stand to Sit Controls descent by using hands   ? Transfers Able to transfer safely, definite need of hands   ? Standing Unsupported with Eyes Closed Able to stand 10 seconds with supervision   ? Standing Unsupported with Feet Together Able to place feet together independently and stand for 1 minute with supervision   ? From Standing, Reach Forward with Outstretched Arm Can reach confidently >25 cm (10")   ? From Standing Position, Pick up Object from FBolivarto pick up shoe safely and easily   ? From Standing Position, Turn to Look Behind Over each Shoulder Needs assist to keep from losing balance and falling   ? Turn 360 Degrees Able to turn 360 degrees safely one side only in 4 seconds or less   ? Standing Unsupported, Alternately Place Feet on Step/Stool Needs assistance to keep from falling or unable to try   ? Standing Unsupported, One Foot in FONEOKbalance while stepping or standing   ? Standing on One Leg Unable to try or needs assist to prevent fall   ? Total Score 34   ? ?  ?  ? ?  ? ? ? ? ? ? ? ? ? ? ? ? ? ?Objective measurements completed on examination: See above findings.  ? ? ? ? ? ? ? ? ? ? ? ? ? ? PT Education - 02/13/22 1022   ? ? Education Details exam findings, POC moving forward; role of PT in reducing fall risk via improving strength and balance; possible benefits of working with speech therapist  given cognitive and memory impairments   ? Person(s) Educated Patient;Spouse   ?  Methods Explanation   ? Comprehension Verbalized understanding   ? ?  ?  ? ?  ? ? ? PT Short Term Goals - 02/13/22 1027   ? ?  ? PT SHORT TERM GOAL #1  ? Title Will be compliant with appropriate progressive HEP with MinA from spouse   ? Time 4   ? Period Weeks   ? Status New   ? Target Date 03/13/22   ?  ? PT SHORT TERM GOAL #2  ? Title Will be able to complete functional sit to stand transfers with no UEs and no more than min guard assist on 8/10 attempts   ? Time 4   ? Period Weeks   ? Status New   ?  ? PT SHORT TERM GOAL #3  ? Title Will be able to complete bed mobility with no more than min guard assist   ? Time 4   ? Period Weeks   ? Status New   ?  ? PT SHORT TERM GOAL #4  ? Title Gait pattern to improve including improved step lengths and increased stance time, at least 50% reduction in shuffling gait pattern   ? Time 4   ? Period Weeks   ? Status New   ?  ? PT SHORT TERM GOAL #5  ? Title Patient and spouse will be able to list at least 3 ways to reduce fall risk at home and in the community   ? Time 4   ? Period Weeks   ? Status New   ? ?  ?  ? ?  ? ? ? ? PT Long Term Goals - 02/13/22 1030   ? ?  ? PT LONG TERM GOAL #1  ? Title MMT to improve by at least 1 grade in all weak groups   ? Time 8   ? Period Weeks   ? Status New   ? Target Date 04/10/22   ?  ? PT LONG TERM GOAL #2  ? Title Berg score to improve to at least 45 in order to show reduced fall risk/improved functional balance   ? Time 8   ? Period Weeks   ? Status New   ?  ? PT LONG TERM GOAL #3  ? Title Will be able to complete floor to stand transfers with no more than MinA from spouse and safe technique   ? Time 8   ? Period Weeks   ? Status New   ?  ? PT LONG TERM GOAL #4  ? Title Will be active in gym/group based exercise activities such as water aerobics or silver sneakers to maintain functional gains and prevent recurrence of symptoms   ? Time 8   ? Period Weeks   ? Status New   ? ?  ?  ? ?  ? ? ? ? ? ? ? ? ? Plan - 02/13/22 1023   ? ? Clinical  Impression Statement Mr. Blais arrives with his wife, who provided most of the information today as Mr. Mcquerry is quite [facility] and also has very poor memory. He has been having a lot of issues with falling recent

## 2022-02-18 ENCOUNTER — Encounter: Payer: Self-pay | Admitting: Physical Therapy

## 2022-02-18 ENCOUNTER — Ambulatory Visit: Payer: Medicare PPO | Admitting: Physical Therapy

## 2022-02-18 DIAGNOSIS — R296 Repeated falls: Secondary | ICD-10-CM | POA: Diagnosis not present

## 2022-02-18 DIAGNOSIS — R262 Difficulty in walking, not elsewhere classified: Secondary | ICD-10-CM

## 2022-02-18 DIAGNOSIS — M6281 Muscle weakness (generalized): Secondary | ICD-10-CM

## 2022-02-18 DIAGNOSIS — R2681 Unsteadiness on feet: Secondary | ICD-10-CM

## 2022-02-18 NOTE — Patient Instructions (Signed)
Access Code: [email or web address] ?URL: [email or web address] ?Date: 02/18/2022 ?Prepared by: Ethel Rana ? ?Exercises ?- Sit to Stand Without Arm Support  - 1 x daily - 7 x weekly - 2 sets - 10 reps ?- Standing Heel Raise with Support  - 1 x daily - 7 x weekly - 2 sets - 10 reps ?- Shoulder Extension with Resistance  - 1 x daily - 7 x weekly - 2 sets - 10 reps ?- Standing Bilateral Low Shoulder Row with Anchored Resistance  - 1 x daily - 7 x weekly - 2 sets - 10 reps ?- Shoulder External Rotation and Scapular Retraction with Resistance  - 1 x daily - 7 x weekly - 2 sets - 10 reps ?- Side Stepping with Counter Support  - 1 x daily - 7 x weekly - 2 sets - 10 reps ?- Standing March with Counter Support  - 1 x daily - 7 x weekly - 2 sets - 10 reps ?

## 2022-02-18 NOTE — Therapy (Signed)
[place] [address] ?[place], [place], [address] ?Phone: [phone]   Fax:  [phone] ? ?Physical Therapy Treatment ? ?Patient Details  ?Name: Juan Montoya ?MRN: [id number] ?Date of Birth: 01-07-1941 ?Referring Provider (PT): Bernerd Limbo ? ? ?Encounter Date: 02/18/2022 ? ? PT End of Session - 02/18/22 1311   ? ? Visit Number 2   ? Number of Visits 17   ? Date for PT Re-Evaluation 04/10/22   ? Authorization Type [employer]   ? Authorization Time Period 02/13/22 to 04/10/22   ? Progress Note Due on Visit 10   ? PT Start Time 1230   ? PT Stop Time 1312   ? PT Time Calculation (min) 42 min   ? Equipment Utilized During Treatment Gait belt   ? Activity Tolerance Patient tolerated treatment well   ? Behavior During Therapy [facility] for tasks assessed/performed   ? ?  ?  ? ?  ? ? ?Past Medical History:  ?Diagnosis Date  ? AAA (abdominal aortic aneurysm) ([place])   ? Abdominal aortic aneurysm (AAA) 3.0 cm to 5.5 cm in diameter in male [facility])   ? Cancer [facility])   ? prostate cancer grade 7   ? Carotid artery occlusion   ? Chronic kidney disease   ? Coronary artery disease   ? Dementia ([place])   ? GERD (gastroesophageal reflux disease)   ? Hyperlipidemia   ? Hypertension   ? Osteoporosis   ? Peripheral vascular disease ([place])   ? S/P colonoscopy 01/12/12  ? Stroke [facility])   ? ? ?Past Surgical History:  ?Procedure Laterality Date  ? CARDIAC CATHETERIZATION  05/2015  ? CORONARY ARTERY BYPASS GRAFT  2009  ? 4 Vessel  ? MASTOIDECTOMY    ? PROSTATE SURGERY    ? ? ?There were no vitals filed for this visit. ? ? Subjective Assessment - 02/18/22 1233   ? ? Subjective Patient reports no new falls, no aches or pains.   ? Patient is accompained by: Family member   ? Patient Stated Goals reduce falls, get stronger, get more active   ? ?  ?  ? ?  ? ? ? ? ? ? ? ? ? ? ? ? ? ? ? ? ? ? ? ? [place] Adult PT Treatment/Exercise - 02/18/22 0001   ? ?  ? Exercises  ? Exercises Knee/Hip   ?  ? Knee/Hip  Exercises: Aerobic  ? Nustep L4 x 6 minutes   ?  ? Knee/Hip Exercises: Standing  ? Heel Raises Both;2 sets;10 reps   ? Knee Flexion Strengthening;Both;1 set;10 reps   ? Other Standing Knee Exercises patient performed B side stepping at counter, 3  each way.   ? Other Standing Knee Exercises Standing shoulder ext, rows, shoulder ER, 2 x 10 reps each.   ? ?  ?  ? ?  ? ? ? ? ? ? ? ? ? ? PT Education - 02/18/22 1310   ? ? Education Details HEP, deep breathing, monitor O2 SATS.   ? Person(s) Educated Spouse;Patient   ? Methods Explanation;Demonstration;Handout   ? Comprehension Verbalized understanding;Returned demonstration   ? ?  ?  ? ?  ? ? ? PT Short Term Goals - 02/18/22 1237   ? ?  ? PT SHORT TERM GOAL #1  ? Title Will be compliant with appropriate progressive HEP with MinA from spouse   ? Baseline Initiated   ? Time 3   ? Period  Weeks   ? Status On-going   ? Target Date 03/13/22   ?  ? PT SHORT TERM GOAL #2  ? Title Will be able to complete functional sit to stand transfers with no UEs and no more than min guard assist on 8/10 attempts   ? Baseline met   ? Time 4   ? Period Weeks   ? Status Achieved   ?  ? PT SHORT TERM GOAL #3  ? Title Will be able to complete bed mobility with no more than min guard assist   ? Time 4   ? Period Weeks   ? Status New   ?  ? PT SHORT TERM GOAL #4  ? Title Gait pattern to improve including improved step lengths and increased stance time, at least 50% reduction in shuffling gait pattern   ? Time 4   ? Period Weeks   ? Status New   ?  ? PT SHORT TERM GOAL #5  ? Title Patient and spouse will be able to list at least 3 ways to reduce fall risk at home and in the community   ? Time 4   ? Period Weeks   ? Status New   ? ?  ?  ? ?  ? ? ? ? PT Long Term Goals - 02/13/22 1030   ? ?  ? PT LONG TERM GOAL #1  ? Title MMT to improve by at least 1 grade in all weak groups   ? Time 8   ? Period Weeks   ? Status New   ? Target Date 04/10/22   ?  ? PT LONG TERM GOAL #2  ? Title Berg score to improve  to at least 45 in order to show reduced fall risk/improved functional balance   ? Time 8   ? Period Weeks   ? Status New   ?  ? PT LONG TERM GOAL #3  ? Title Will be able to complete floor to stand transfers with no more than MinA from spouse and safe technique   ? Time 8   ? Period Weeks   ? Status New   ?  ? PT LONG TERM GOAL #4  ? Title Will be active in gym/group based exercise activities such as water aerobics or silver sneakers to maintain functional gains and prevent recurrence of symptoms   ? Time 8   ? Period Weeks   ? Status New   ? ?  ?  ? ?  ? ? ? ? ? ? ? ? Plan - 02/18/22 1312   ? ? Clinical Impression Statement Patient presents iwth no new issues, no new falls. Established initial HEP, trying to keep exercises functional to ease his and wife's ability to perform at home. He participated well. His O2 SATS were at 88 after his warm up on Nu-Step, so he used O2 for the rest of treatment and PT reminded him to take deep breaths throughout. SATS remained > 93% for the rest of treatmnet.   ? Personal Factors and Comorbidities Age;Fitness;Behavior Pattern;Comorbidity 3+;Time since onset of injury/illness/exacerbation   ? Examination-Activity Limitations Locomotion Level;Transfers;Bed Mobility;Squat;Stairs;Stand   ? Examination-Participation Restrictions Community Activity;Shop;Laundry;Volunteer;[employer] Work   ? Stability/Clinical Decision Making Stable/Uncomplicated   ? Clinical Decision Making Low   ? Rehab Potential Good   ? PT Frequency 2x / week   ? PT Duration 8 weeks   ? PT Treatment/Interventions ADLs/Self Care Home Management;Gait training;Therapeutic activities;Therapeutic exercise;Manual techniques;Neuromuscular re-education   ?  PT Next Visit Plan focus on balance, strength, functional activity tolerance; needs HEP still. Be mindful of O2, need to check with exercise (uses O2 PRN)   ? PT Home Exercise Plan Assess tolerance to HEP and O2 SATS.   ? Consulted and Agree with Plan of Care Patient;Family  member/caregiver   ? Family Member Consulted spouse   ? ?  ?  ? ?  ? ? ?Patient will benefit from skilled therapeutic intervention in order to improve the following deficits and impairments:  Abnormal gait, Decreased coordination, Difficulty walking, Decreased endurance, Decreased safety awareness, Decreased activity tolerance, Decreased knowledge of precautions, Impaired vision/preception, Decreased balance, Decreased knowledge of use of DME, Decreased cognition, Decreased mobility, Decreased strength, Postural dysfunction ? ?Visit Diagnosis: ?Repeated falls ? ?Unsteadiness on feet ? ?Muscle weakness (generalized) ? ?Difficulty in walking, not elsewhere classified ? ? ? ? ?Problem List ?Patient Active Problem List  ? Diagnosis Date Noted  ? Chronic heart failure with preserved ejection fraction ([place]) 03/25/2021  ? Prostate cancer ([place]) 02/06/2021  ? 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 Bilateral hearing loss 03/20/2020  ? Chronic respiratory failure with hypoxia ([place]) 07/20/2019  ? DOE (dyspnea on exertion) 09/03/2017  ? Prediabetes 09/03/2017  ? Personal history of colonic polyps 07/17/2017  ? Tobacco abuse, in remission 07/01/2017  ? GERD (gastroesophageal reflux disease) 06/26/2017  ? Chest pain 06/25/2017  ? Dizziness 01/07/2017  ? Pure hypercholesterolemia 08/27/2016  ? BRBPR (bright red blood per rectum) 08/27/2016  ? Age-related osteoporosis without current pathological fracture 08/07/2016  ? Mixed hyperlipidemia 02/01/2016  ? Leg pain, left 01/12/2016  ? Coronary artery disease involving coronary bypass graft of native heart with angina pectoris ([place]) 06/13/2015  ? Alzheimer's disease ([place]) 04/26/2015  ? 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 artery ([place]) 04/26/2015  ? 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 artery ([place]) 04/26/2015  ? 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 Palpitations 04/03/2015  ? S/P CABG (coronary artery  bypass graft) 04/03/2015  ? Blind left eye 12/27/2014  ? Encounter for general adult medical examination without abnormal findings 12/27/2014  ? Small vessel disease, cerebrovascular 11/02/2014  ? Arteriovenous ma

## 2022-02-20 ENCOUNTER — Ambulatory Visit: Payer: Medicare PPO | Admitting: Physical Therapy

## 2022-02-20 DIAGNOSIS — R296 Repeated falls: Secondary | ICD-10-CM | POA: Diagnosis not present

## 2022-02-20 DIAGNOSIS — R262 Difficulty in walking, not elsewhere classified: Secondary | ICD-10-CM

## 2022-02-20 DIAGNOSIS — R2681 Unsteadiness on feet: Secondary | ICD-10-CM

## 2022-02-20 DIAGNOSIS — M6281 Muscle weakness (generalized): Secondary | ICD-10-CM

## 2022-02-20 NOTE — Therapy (Signed)
[facility] ?[place], [place], [address] ?Phone: [phone]   Fax:  [phone] ? ?Physical Therapy Treatment ? ?Patient Details  ?Name: Juan Montoya ?MRN: [id number] ?Date of Birth: [birth year] ?Referring Provider (PT): Bernerd Limbo ? ? ?Encounter Date: 02/20/2022 ? ? PT End of Session - 02/20/22 1110   ? ? Visit Number 3   ? Number of Visits 17   ? Date for PT Re-Evaluation 04/10/22   ? Authorization Type [employer]   ? Authorization Time Period 02/13/22 to 04/10/22   ? Progress Note Due on Visit 10   ? PT Start Time 1101   ? PT Stop Time 1145   ? PT Time Calculation (min) 44 min   ? Equipment Utilized During Treatment Gait belt   ? Activity Tolerance Patient tolerated treatment well   ? Behavior During Therapy [facility] for tasks assessed/performed   ? ?  ?  ? ?  ? ? ?Past Medical History:  ?Diagnosis Date  ? AAA (abdominal aortic aneurysm) ([place])   ? Abdominal aortic aneurysm (AAA) 3.0 cm to 5.5 cm in diameter in male [facility])   ? Cancer [facility])   ? prostate cancer grade 7   ? Carotid artery occlusion   ? Chronic kidney disease   ? Coronary artery disease   ? Dementia ([place])   ? GERD (gastroesophageal reflux disease)   ? Hyperlipidemia   ? Hypertension   ? Osteoporosis   ? Peripheral vascular disease ([place])   ? S/P colonoscopy 01/12/12  ? Stroke [facility])   ? ? ?Past Surgical History:  ?Procedure Laterality Date  ? CARDIAC CATHETERIZATION  05/2015  ? CORONARY ARTERY BYPASS GRAFT  2009  ? 4 Vessel  ? MASTOIDECTOMY    ? PROSTATE SURGERY    ? ? ?There were no vitals filed for this visit. ? ? Subjective Assessment - 02/20/22 1103   ? ? Subjective Pt states that things are going well. His wife notes that they did all of the HEP except for the bands and had no issues.   ? Patient is accompained by: Family member   ? Patient Stated Goals reduce falls, get stronger, get more active   ? Currently in Pain? No/denies   ? ?  ?  ? ?  ? ? ? ? ? ? ? ? ? ? ? ? ? ? ? ? ? ? ? ? [place]  Adult PT Treatment/Exercise - 02/20/22 0001   ? ?  ? Neuro Re-ed   ? Neuro Re-ed Details  standing NBOS on foam pad with diagonals holding yellow weighted ball x10 reps each   ?  ? Knee/Hip Exercises: Standing  ? Knee Flexion 1 set;Both;10 reps   ? Knee Flexion Limitations 2.5#   ? Forward Step Up 2 sets;Both;10 reps;Hand Hold: 1;Step Height: 4"   ? Forward Step Up Limitations O2 sats maintained in low 90s, able to increase from 88 with breathing cues   ? Other Standing Knee Exercises --   ? Other Standing Knee Exercises knee flexion with 2.5# ankle weights x20 reps   ?  ? Knee/Hip Exercises: Seated  ? Other Seated Knee/Hip Exercises trunk rotation with red TB x10 reps- PT cuing for breathing   ? Sit to Sand 2 sets;5 reps;without UE support   on foam pad, x10 reps initially with stable surface  ? ?  ?  ? ?  ? ? ? ? ? ? ? ? ? ? PT Education -  02/20/22 1204   ? ? Education Details technique with therex   ? Person(s) Educated Patient   ? Methods Explanation;Tactile cues;Verbal cues   ? Comprehension Verbalized understanding   ? ?  ?  ? ?  ? ? ? PT Short Term Goals - 02/18/22 1237   ? ?  ? PT SHORT TERM GOAL #1  ? Title Will be compliant with appropriate progressive HEP with MinA from spouse   ? Baseline Initiated   ? Time 3   ? Period Weeks   ? Status On-going   ? Target Date 03/13/22   ?  ? PT SHORT TERM GOAL #2  ? Title Will be able to complete functional sit to stand transfers with no UEs and no more than min guard assist on 8/10 attempts   ? Baseline met   ? Time 4   ? Period Weeks   ? Status Achieved   ?  ? PT SHORT TERM GOAL #3  ? Title Will be able to complete bed mobility with no more than min guard assist   ? Time 4   ? Period Weeks   ? Status New   ?  ? PT SHORT TERM GOAL #4  ? Title Gait pattern to improve including improved step lengths and increased stance time, at least 50% reduction in shuffling gait pattern   ? Time 4   ? Period Weeks   ? Status New   ?  ? PT SHORT TERM GOAL #5  ? Title Patient and  spouse will be able to list at least 3 ways to reduce fall risk at home and in the community   ? Time 4   ? Period Weeks   ? Status New   ? ?  ?  ? ?  ? ? ? ? PT Long Term Goals - 02/13/22 1030   ? ?  ? PT LONG TERM GOAL #1  ? Title MMT to improve by at least 1 grade in all weak groups   ? Time 8   ? Period Weeks   ? Status New   ? Target Date 04/10/22   ?  ? PT LONG TERM GOAL #2  ? Title Berg score to improve to at least 45 in order to show reduced fall risk/improved functional balance   ? Time 8   ? Period Weeks   ? Status New   ?  ? PT LONG TERM GOAL #3  ? Title Will be able to complete floor to stand transfers with no more than MinA from spouse and safe technique   ? Time 8   ? Period Weeks   ? Status New   ?  ? PT LONG TERM GOAL #4  ? Title Will be active in gym/group based exercise activities such as water aerobics or silver sneakers to maintain functional gains and prevent recurrence of symptoms   ? Time 8   ? Period Weeks   ? Status New   ? ?  ?  ? ?  ? ? ? ? ? ? ? ? Plan - 02/20/22 1205   ? ? Clinical Impression Statement Pt continues to work on his HEP with his wife and denies issues with this. Session focused on therex to improve trunk and LE strength. Pt required frequent PT cues to ensure he was using proper technique during several of his exercises. Pt denied any discomfort and O2 sats were monitored throughout the session. Overall, pt was able to maintain low  90s throughout his session with occasional drop to 88%. This quickly improved with a rest break and focus on slow breathing. Pt was able to perform progression in sit to stand without noted difficulty. PT discussed performing more sets with his sit to stand at home and both the pt and his wife were in agreement.   ? Personal Factors and Comorbidities Age;Fitness;Behavior Pattern;Comorbidity 3+;Time since onset of injury/illness/exacerbation   ? Examination-Activity Limitations Locomotion Level;Transfers;Bed Mobility;Squat;Stairs;Stand   ?  Examination-Participation Restrictions Community Activity;Shop;Laundry;Volunteer;[employer] Work   ? Stability/Clinical Decision Making Stable/Uncomplicated   ? Rehab Potential Good   ? PT Frequency 2x / week   ? PT Duration 8 weeks   ? PT Treatment/Interventions ADLs/Self Care Home Management;Gait training;Therapeutic activities;Therapeutic exercise;Manual techniques;Neuromuscular re-education   ? PT Next Visit Plan focus on balance, strength, functional activity tolerance; Be mindful of O2, need to check with exercise (uses O2 PRN)   ? PT [place]   ? Recommended Other Services oxygen PRN   ? Consulted and Agree with Plan of Care Patient;Family member/caregiver   ? Family Member Consulted spouse   ? ?  ?  ? ?  ? ? ?Patient will benefit from skilled therapeutic intervention in order to improve the following deficits and impairments:  Abnormal gait, Decreased coordination, Difficulty walking, Decreased endurance, Decreased safety awareness, Decreased activity tolerance, Decreased knowledge of precautions, Impaired vision/preception, Decreased balance, Decreased knowledge of use of DME, Decreased cognition, Decreased mobility, Decreased strength, Postural dysfunction ? ?Visit Diagnosis: ?Repeated falls ? ?Unsteadiness on feet ? ?Difficulty in walking, not elsewhere classified ? ?Muscle weakness (generalized) ? ? ? ? ?Problem List ?Patient Active Problem List  ? Diagnosis Date Noted  ? Chronic heart failure with preserved ejection fraction ([place]) 03/25/2021  ? Prostate cancer ([place]) 02/06/2021  ? 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 Bilateral hearing loss 03/20/2020  ? Chronic respiratory failure with hypoxia ([place]) 07/20/2019  ? DOE (dyspnea on exertion) 09/03/2017  ? Prediabetes 09/03/2017  ? Personal history of colonic polyps 07/17/2017  ? Tobacco abuse, in remission 07/01/2017  ? GERD (gastroesophageal reflux disease) 06/26/2017  ? Chest pain 06/25/2017  ? Dizziness 01/07/2017  ? Pure  hypercholesterolemia 08/27/2016  ? BRBPR (bright red blood per rectum) 08/27/2016  ? Age-related osteoporosis without current pathological fracture 08/07/2016  ? Mixed hyperlipidemia 02/01/2016  ? Leg pain, left 01/12/19

## 2022-02-25 ENCOUNTER — Ambulatory Visit: Payer: Medicare PPO | Attending: Family Medicine | Admitting: Physical Therapy

## 2022-02-25 ENCOUNTER — Encounter: Payer: Self-pay | Admitting: Physical Therapy

## 2022-02-25 DIAGNOSIS — M6281 Muscle weakness (generalized): Secondary | ICD-10-CM | POA: Insufficient documentation

## 2022-02-25 DIAGNOSIS — R296 Repeated falls: Secondary | ICD-10-CM | POA: Diagnosis present

## 2022-02-25 DIAGNOSIS — R262 Difficulty in walking, not elsewhere classified: Secondary | ICD-10-CM | POA: Diagnosis present

## 2022-02-25 DIAGNOSIS — R2681 Unsteadiness on feet: Secondary | ICD-10-CM | POA: Insufficient documentation

## 2022-02-25 NOTE — Therapy (Signed)
?[facility] ?[address] ?[place], [place], [address] ?Phone: [phone]   Fax:  [phone] ? ?Physical Therapy Treatment ? ?Patient Details  ?Name: Juan Montoya ?MRN: [id number] ?Date of Birth: 08/05/1941 ?Referring Provider (PT): David Bouska ? ? ?Encounter Date: 02/25/2022 ? ? PT End of Session - 02/25/22 1017   ? ? Visit Number 4   ? Number of Visits 17   ? Date for PT Re-Evaluation 04/10/22   ? Authorization Type [employer]   ? Authorization Time Period 02/13/22 to 04/10/22   ? Progress Note Due on Visit 10   ? PT Start Time 0931   ? PT Stop Time 1012   ? PT Time Calculation (min) 41 min   ? Activity Tolerance Patient tolerated treatment well   ? Behavior During Therapy WFL for tasks assessed/performed   ? ?  ?  ? ?  ? ? ?Past Medical History:  ?Diagnosis Date  ? AAA (abdominal aortic aneurysm) ([facility])   ? Abdominal aortic aneurysm (AAA) 3.0 cm to 5.5 cm in diameter in male ([facility])   ? Cancer ([facility])   ? prostate cancer grade 7   ? Carotid artery occlusion   ? Chronic kidney disease   ? Coronary artery disease   ? Dementia ([facility])   ? GERD (gastroesophageal reflux disease)   ? Hyperlipidemia   ? Hypertension   ? Osteoporosis   ? Peripheral vascular disease ([facility])   ? S/P colonoscopy 01/12/12  ? Stroke ([facility])   ? ? ?Past Surgical History:  ?Procedure Laterality Date  ? CARDIAC CATHETERIZATION  05/2015  ? CORONARY ARTERY BYPASS GRAFT  2009  ? 4 Vessel  ? MASTOIDECTOMY    ? PROSTATE SURGERY    ? ? ?There were no vitals filed for this visit. ? ? Subjective Assessment - 02/25/22 0933   ? ? Subjective Doing OK with exercise with a little bit of help, does well with a little bit of help.   ? Patient is accompained by: Family member   ? Patient Stated Goals reduce falls, get stronger, get more active   ? Currently in Pain? No/denies   ? ?  ?  ? ?  ? ? ? ? ? ? ? ? ? ? ? ? ? ? ? ? ? ? ? ? [facility] Adult PT Treatment/Exercise - 02/25/22 0001   ? ?  ? Knee/Hip Exercises: Aerobic  ?  Nustep L5x6 minutes BLEs only   ?  ? Knee/Hip Exercises: Standing  ? Heel Raises Both;1 set;15 reps   ? Heel Raises Limitations --   ? Forward Step Up Both;1 set;15 reps;Hand Hold: 2;Step Height: 4"   ? Other Standing Knee Exercises standing marches 3# each LE x20   ?  ? Knee/Hip Exercises: Seated  ? Sit to Sand 1 set;without UE support   x12  ? ?  ?  ? ?  ? ? ? ? ? ? Balance Exercises - 02/25/22 0001   ? ?  ? Balance Exercises: Standing  ? Standing Eyes Closed Narrow base of support (BOS);1 rep;Solid surface;30 secs   ? Tandem Stance Eyes open;3 reps;30 secs   ? Standing, One Foot on a Step Eyes open;6 inch;3 reps;30 secs   ? ?  ?  ? ?  ? ? ? ? ? PT Education - 02/25/22 1017   ? ? Education Details exericse form, PLB   ? Person(s) Educated Patient;Child(ren)   ? Methods Explanation   ?   Comprehension Verbalized understanding   ? ?  ?  ? ?  ? ? ? PT Short Term Goals - 02/18/22 1237   ? ?  ? PT SHORT TERM GOAL #1  ? Title Will be compliant with appropriate progressive HEP with MinA from spouse   ? Baseline Initiated   ? Time 3   ? Period Weeks   ? Status On-going   ? Target Date 03/13/22   ?  ? PT SHORT TERM GOAL #2  ? Title Will be able to complete functional sit to stand transfers with no UEs and no more than min guard assist on 8/10 attempts   ? Baseline met   ? Time 4   ? Period Weeks   ? Status Achieved   ?  ? PT SHORT TERM GOAL #3  ? Title Will be able to complete bed mobility with no more than min guard assist   ? Time 4   ? Period Weeks   ? Status New   ?  ? PT SHORT TERM GOAL #4  ? Title Gait pattern to improve including improved step lengths and increased stance time, at least 50% reduction in shuffling gait pattern   ? Time 4   ? Period Weeks   ? Status New   ?  ? PT SHORT TERM GOAL #5  ? Title Patient and spouse will be able to list at least 3 ways to reduce fall risk at home and in the community   ? Time 4   ? Period Weeks   ? Status New   ? ?  ?  ? ?  ? ? ? ? PT Long Term Goals - 02/13/22 1030   ? ?  ?  PT LONG TERM GOAL #1  ? Title MMT to improve by at least 1 grade in all weak groups   ? Time 8   ? Period Weeks   ? Status New   ? Target Date 04/10/22   ?  ? PT LONG TERM GOAL #2  ? Title Berg score to improve to at least 45 in order to show reduced fall risk/improved functional balance   ? Time 8   ? Period Weeks   ? Status New   ?  ? PT LONG TERM GOAL #3  ? Title Will be able to complete floor to stand transfers with no more than MinA from spouse and safe technique   ? Time 8   ? Period Weeks   ? Status New   ?  ? PT LONG TERM GOAL #4  ? Title Will be active in gym/group based exercise activities such as water aerobics or silver sneakers to maintain functional gains and prevent recurrence of symptoms   ? Time 8   ? Period Weeks   ? Status New   ? ?  ?  ? ?  ? ? ? ? ? ? ? ? Plan - 02/25/22 1018   ? ? Clinical Impression Statement Mr. Fye arrives today doing OK, we continued working on functional strength, functional activity tolerance, and balance. HOH and needs repeated verbal and visual cues for good form. SpO2 kept dropping with exercise today despite repeated cues for PLB, we did ultimately use 2LPM O2 to keep sats above 88%. Does a lot better with combination of tactile and visual cues rather than verbal cues or explanation. Will continue efforts.   ? Personal Factors and Comorbidities Age;Fitness;Behavior Pattern;Comorbidity 3+;Time since onset of injury/illness/exacerbation   ? Examination-Activity  Limitations Locomotion Level;Transfers;Bed Mobility;Squat;Stairs;Stand   ? Examination-Participation Restrictions Community Activity;Shop;Laundry;Volunteer;Yard Work   ? Stability/Clinical Decision Making Stable/Uncomplicated   ? Clinical Decision Making Low   ? Rehab Potential Good   ? PT Frequency 2x / week   ? PT Duration 8 weeks   ? PT Treatment/Interventions ADLs/Self Care Home Management;Gait training;Therapeutic activities;Therapeutic exercise;Manual techniques;Neuromuscular re-education   ? PT Next  Visit Plan focus on balance, strength, functional activity tolerance; Be mindful of O2, need to check with exercise per MD needs to be at least 88%  (uses O2 PRN)   ? PT Home Exercise Plan [id number]   ? Consulted and Agree with Plan of Care Patient;Family member/caregiver   ? ?  ?  ? ?  ? ? ?Patient will benefit from skilled therapeutic intervention in order to improve the following deficits and impairments:  Abnormal gait, Decreased coordination, Difficulty walking, Decreased endurance, Decreased safety awareness, Decreased activity tolerance, Decreased knowledge of precautions, Impaired vision/preception, Decreased balance, Decreased knowledge of use of DME, Decreased cognition, Decreased mobility, Decreased strength, Postural dysfunction ? ?Visit Diagnosis: ?Repeated falls ? ?Unsteadiness on feet ? ?Difficulty in walking, not elsewhere classified ? ?Muscle weakness (generalized) ? ? ? ? ?Problem List ?Patient Active Problem List  ? Diagnosis Date Noted  ? Chronic heart failure with preserved ejection fraction ([facility]) 03/25/2021  ? Prostate cancer ([facility]) 02/06/2021  ? 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[facility]) 07/26/2020  ? Bilateral hearing loss 03/20/2020  ? Chronic respiratory failure with hypoxia ([facility]) 07/20/2019  ? DOE (dyspnea on exertion) 09/03/2017  ? Prediabetes 09/03/2017  ? Personal history of colonic polyps 07/17/2017  ? Tobacco abuse, in remission 07/01/2017  ? GERD (gastroesophageal reflux disease) 06/26/2017  ? Chest pain 06/25/2017  ? Dizziness 01/07/2017  ? Pure hypercholesterolemia 08/27/2016  ? BRBPR (bright red blood per rectum) 08/27/2016  ? Age-related osteoporosis without current pathological fracture 08/07/2016  ? Mixed hyperlipidemia 02/01/2016  ? Leg pain, left 01/12/2016  ? 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 Alzheimer's disease ([facility]) 04/26/2015  ? 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 artery ([facility]) 04/26/2015  ? 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 Palpitations 04/03/2015  ? S/P CABG (coronary

## 2022-02-27 ENCOUNTER — Ambulatory Visit: Payer: Medicare PPO | Admitting: Physical Therapy

## 2022-02-28 ENCOUNTER — Encounter: Payer: Self-pay | Admitting: Physical Therapy

## 2022-02-28 ENCOUNTER — Ambulatory Visit: Payer: Medicare PPO | Admitting: Physical Therapy

## 2022-02-28 DIAGNOSIS — M6281 Muscle weakness (generalized): Secondary | ICD-10-CM

## 2022-02-28 DIAGNOSIS — R296 Repeated falls: Secondary | ICD-10-CM | POA: Diagnosis not present

## 2022-02-28 DIAGNOSIS — R2681 Unsteadiness on feet: Secondary | ICD-10-CM

## 2022-02-28 DIAGNOSIS — R262 Difficulty in walking, not elsewhere classified: Secondary | ICD-10-CM

## 2022-02-28 NOTE — Therapy (Signed)
[facility] ?[place], [place], [address] ?Phone: [phone]   Fax:  [phone] ? ?Physical Therapy Treatment ? ?Patient Details  ?Name: Juan Montoya ?MRN: [id number] ?Date of Birth: 04-Aug-1941 ?Referring Provider (PT): Bernerd Limbo ? ? ?Encounter Date: 02/28/2022 ? ? PT End of Session - 02/28/22 1201   ? ? Visit Number 5   ? Number of Visits 17   ? Date for PT Re-Evaluation 04/10/22   ? Authorization Type Hum[employer]   ? PT Start Time [phone]   ? PT Stop Time 1010   ? PT Time Calculation (min) 44 min   ? Activity Tolerance Patient tolerated treatment well   ? Behavior During Therapy [facility] for tasks assessed/performed   ? ?  ?  ? ?  ? ? ?Past Medical History:  ?Diagnosis Date  ? AAA (abdominal aortic aneurysm) ([place])   ? Abdominal aortic aneurysm (AAA) 3.0 cm to 5.5 cm in diameter in male [facility])   ? Cancer [facility])   ? prostate cancer grade 7   ? Carotid artery occlusion   ? Chronic kidney disease   ? Coronary artery disease   ? Dementia ([place])   ? GERD (gastroesophageal reflux disease)   ? Hyperlipidemia   ? Hypertension   ? Osteoporosis   ? Peripheral vascular disease ([place])   ? S/P colonoscopy 01/12/12  ? Stroke [facility])   ? ? ?Past Surgical History:  ?Procedure Laterality Date  ? CARDIAC CATHETERIZATION  05/2015  ? CORONARY ARTERY BYPASS GRAFT  2009  ? 4 Vessel  ? MASTOIDECTOMY    ? PROSTATE SURGERY    ? ? ?There were no vitals filed for this visit. ? ? Subjective Assessment - 02/28/22 0933   ? ? Subjective Wife reports that she forgot the oxygen, O2 sats were 90% starting out   ? ?  ?  ? ?  ? ? ? ? ? ? ? ? ? ? ? ? ? ? ? ? ? ? ? ? [place] Adult PT Treatment/Exercise - 02/28/22 0001   ? ?  ? Ambulation/Gait  ? Gait Comments gait outside around the parking island in the back, O2 dropped to 84% on this, then outside from back door around to the car   ?  ? High Level Balance  ? High Level Balance Activities Side stepping;Backward walking;Direction changes   ? High Level  Balance Comments standing ball toss off and on airex, cone toe touches on and off airex   ?  ? Knee/Hip Exercises: Aerobic  ? Nustep L5x6 minutes   ?  ? Knee/Hip Exercises: Machines for Strengthening  ? Cybex Knee Extension 5# 2x10   ? Cybex Knee Flexion 20# 3x10   ? ?  ?  ? ?  ? ? ? ? ? ? ? ? ? ? ? ? PT Short Term Goals - 02/18/22 1237   ? ?  ? PT SHORT TERM GOAL #1  ? Title Will be compliant with appropriate progressive HEP with MinA from spouse   ? Baseline Initiated   ? Time 3   ? Period Weeks   ? Status On-going   ? Target Date 03/13/22   ?  ? PT SHORT TERM GOAL #2  ? Title Will be able to complete functional sit to stand transfers with no UEs and no more than min guard assist on 8/10 attempts   ? Baseline met   ? Time 4   ? Period Weeks   ?  Status Achieved   ?  ? PT SHORT TERM GOAL #3  ? Title Will be able to complete bed mobility with no more than min guard assist   ? Time 4   ? Period Weeks   ? Status New   ?  ? PT SHORT TERM GOAL #4  ? Title Gait pattern to improve including improved step lengths and increased stance time, at least 50% reduction in shuffling gait pattern   ? Time 4   ? Period Weeks   ? Status New   ?  ? PT SHORT TERM GOAL #5  ? Title Patient and spouse will be able to list at least 3 ways to reduce fall risk at home and in the community   ? Time 4   ? Period Weeks   ? Status New   ? ?  ?  ? ?  ? ? ? ? PT Long Term Goals - 02/28/22 1206   ? ?  ? PT LONG TERM GOAL #1  ? Title MMT to improve by at least 1 grade in all weak groups   ? Status On-going   ?  ? PT LONG TERM GOAL #2  ? Title Berg score to improve to at least 45 in order to show reduced fall risk/improved functional balance   ? Status On-going   ?  ? PT LONG TERM GOAL #3  ? Title Will be able to complete floor to stand transfers with no more than MinA from spouse and safe technique   ? Status On-going   ? ?  ?  ? ?  ? ? ? ? ? ? ? ? Plan - 02/28/22 1201   ? ? Clinical Impression Statement They did not bring oxygen today, His O2  saturation ranged from 84% to 94% without oxygen, dropped to 84% with the long walk around the parking island.  His wife does very well giving him cues to breathe, he needs cues to do the pursed lip breathing.  With cues his O2 saturation will recover in under 2 minutes.  He did well with balance, a few times lost balance on the airex but had a good step reaction.   ? PT Next Visit Plan focus on balance, strength, functional activity tolerance; Be mindful of O2, need to check with exercise per MD needs to be at least 88%  (uses O2 PRN)   ? Consulted and Agree with Plan of Care Patient;Family member/caregiver   ? ?  ?  ? ?  ? ? ?Patient will benefit from skilled therapeutic intervention in order to improve the following deficits and impairments:  Abnormal gait, Decreased coordination, Difficulty walking, Decreased endurance, Decreased safety awareness, Decreased activity tolerance, Decreased knowledge of precautions, Impaired vision/preception, Decreased balance, Decreased knowledge of use of DME, Decreased cognition, Decreased mobility, Decreased strength, Postural dysfunction ? ?Visit Diagnosis: ?Repeated falls ? ?Unsteadiness on feet ? ?Difficulty in walking, not elsewhere classified ? ?Muscle weakness (generalized) ? ? ? ? ?Problem List ?Patient Active Problem List  ? Diagnosis Date Noted  ? Chronic heart failure with preserved ejection fraction ([place]) 03/25/2021  ? Prostate cancer ([place]) 02/06/2021  ? 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 Bilateral hearing loss 03/20/2020  ? Chronic respiratory failure with hypoxia ([place]) 07/20/2019  ? DOE (dyspnea on exertion) 09/03/2017  ? Prediabetes 09/03/2017  ? Personal history of colonic polyps 07/17/2017  ? Tobacco abuse, in remission 07/01/2017  ? GERD (gastroesophageal reflux disease) 06/26/2017  ? Chest pain 06/25/2017  ?  Dizziness 01/07/2017  ? Pure hypercholesterolemia 08/27/2016  ? BRBPR (bright red blood per rectum) 08/27/2016  ? Age-related osteoporosis  without current pathological fracture 08/07/2016  ? Mixed hyperlipidemia 02/01/2016  ? Leg pain, left 01/12/2016  ? Coronary artery disease involving coronary bypass graft of native heart with angina pectoris ([place]) 06/13/2015  ? Alzheimer's disease ([place]) 04/26/2015  ? 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 artery ([place]) 04/26/2015  ? 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 artery ([place]) 04/26/2015  ? 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 Palpitations 04/03/2015  ? S/P CABG (coronary artery bypass graft) 04/03/2015  ? Blind left eye 12/27/2014  ? Encounter for general adult medical examination without abnormal findings 12/27/2014  ? Small vessel disease, cerebrovascular 11/02/2014  ? Arteriovenous malformation of colon 06/25/2014  ? Benign localized hyperplasia of prostate with urinary obstruction 06/25/2014  ? Chronic kidney disease (CKD), stage III (moderate) ([place]) 06/25/2014  ? COPD (chronic obstructive pulmonary disease) ([place]) 06/25/2014  ? Essential hypertension 06/25/2014  ? Abdominal aortic aneurysm, without rupture, unspecified ([place]) 06/25/2014  ? Benign neoplasm of colon 06/25/2014  ? Carotid artery stenosis 06/25/2014  ? Centrilobular emphysema ([place]) 06/25/2014  ? Diverticulosis of intestine, part unspecified, without perforation or abscess without bleeding 06/25/2014  ? Hearing loss 06/25/2014  ? Ischemic colitis ([place]) 06/25/2014  ? Mixed conductive and sensorineural hearing loss of left ear with restricted hearing of right ear 06/25/2014  ? Nocturia 06/25/2014  ? Occlusion and stenosis of multiple and bilateral precerebral arteries 06/25/2014  ? Organic impotence 06/25/2014  ? Postmastoidectomy complication 46/52/0761  ? Testicular hypofunction 06/25/2014  ? Unilateral inguinal hernia, without obstruction or gangrene, not specified as recurrent 06/25/2014  ? Urinary urgency 06/25/2014  ? Visual impairment 06/25/2014  ? Occlusion and  stenosis of carotid artery without mention of cerebral infarction 03/22/2014  ? Aneurysm of abdominal vessel ([place]) 03/17/2012  ? ? Sumner Boast, PT ?02/28/2022, 12:07 PM ? ?[place] ?Outpatient Rehabilitation

## 2022-03-04 ENCOUNTER — Ambulatory Visit: Payer: Medicare PPO | Admitting: Physical Therapy

## 2022-03-04 ENCOUNTER — Encounter: Payer: Self-pay | Admitting: Physical Therapy

## 2022-03-04 DIAGNOSIS — R296 Repeated falls: Secondary | ICD-10-CM | POA: Diagnosis not present

## 2022-03-04 DIAGNOSIS — R2681 Unsteadiness on feet: Secondary | ICD-10-CM

## 2022-03-04 DIAGNOSIS — R262 Difficulty in walking, not elsewhere classified: Secondary | ICD-10-CM

## 2022-03-04 DIAGNOSIS — M6281 Muscle weakness (generalized): Secondary | ICD-10-CM

## 2022-03-04 NOTE — Therapy (Signed)
[facility] ?[place], [place], [address] ?Phone: [phone]   Fax:  [phone] ? ?Physical Therapy Treatment ? ?Patient Details  ?Name: Juan Montoya ?MRN: [id number] ?Date of Birth: April 29, 1941 ?Referring Provider (PT): Bernerd Limbo ? ? ?Encounter Date: 03/04/2022 ? ? PT End of Session - 03/04/22 1022   ? ? Visit Number 6   ? Number of Visits 17   ? Date for PT Re-Evaluation 04/10/22   ? Authorization Type Humana   ? Authorization Time Period 02/13/22 to 04/10/22   ? Progress Note Due on Visit 10   ? PT Start Time [phone]   arrived a few minutes late  ? PT Stop Time 1013   ? PT Time Calculation (min) 36 min   ? Activity Tolerance Patient tolerated treatment well   ? Behavior During Therapy [facility] for tasks assessed/performed   ? ?  ?  ? ?  ? ? ?Past Medical History:  ?Diagnosis Date  ? AAA (abdominal aortic aneurysm) ([place])   ? Abdominal aortic aneurysm (AAA) 3.0 cm to 5.5 cm in diameter in male [facility])   ? Cancer [facility])   ? prostate cancer grade 7   ? Carotid artery occlusion   ? Chronic kidney disease   ? Coronary artery disease   ? Dementia ([place])   ? GERD (gastroesophageal reflux disease)   ? Hyperlipidemia   ? Hypertension   ? Osteoporosis   ? Peripheral vascular disease ([place])   ? S/P colonoscopy 01/12/12  ? Stroke [facility])   ? ? ?Past Surgical History:  ?Procedure Laterality Date  ? CARDIAC CATHETERIZATION  05/2015  ? CORONARY ARTERY BYPASS GRAFT  2009  ? 4 Vessel  ? MASTOIDECTOMY    ? PROSTATE SURGERY    ? ? ?There were no vitals filed for this visit. ? ? Subjective Assessment - 03/04/22 0937   ? ? Subjective Doing OK, no pain nothing new going on no falls   ? Patient is accompained by: Family member   ? Patient Stated Goals reduce falls, get stronger, get more active   ? Currently in Pain? No/denies   ? ?  ?  ? ?  ? ? ? ? ? ? ? ? ? ? ? ? ? ? ? ? ? ? ? ? [place] Adult PT Treatment/Exercise - 03/04/22 0001   ? ?  ? Knee/Hip Exercises: Aerobic  ? Nustep L5x6  minutes   ?  ? Knee/Hip Exercises: Standing  ? Other Standing Knee Exercises farmers carry 6# 329f   ? Other Standing Knee Exercises standing marches [id number]; side steps 4021f; walking sat test   ? ?  ?  ? ?  ? ? ? ? ? ? Balance Exercises - 03/04/22 0001   ? ?  ? Balance Exercises: Standing  ? Tandem Stance Eyes open;1 rep   60 seconds  ? ?  ?  ? ?  ? ? ? ? ? PT Education - 03/04/22 1021   ? ? Education Details wear O2 in PT from now on out- consistently desaturating on RA   ? Person(s) Educated Patient;Child(ren)   ? Methods Explanation   ? Comprehension Verbalized understanding   ? ?  ?  ? ?  ? ? ? PT Short Term Goals - 02/18/22 1237   ? ?  ? PT SHORT TERM GOAL #1  ? Title Will be compliant with appropriate progressive HEP with MinA from spouse   ? Baseline Initiated   ?  Time 3   ? Period Weeks   ? Status On-going   ? Target Date 03/13/22   ?  ? PT SHORT TERM GOAL #2  ? Title Will be able to complete functional sit to stand transfers with no UEs and no more than min guard assist on 8/10 attempts   ? Baseline met   ? Time 4   ? Period Weeks   ? Status Achieved   ?  ? PT SHORT TERM GOAL #3  ? Title Will be able to complete bed mobility with no more than min guard assist   ? Time 4   ? Period Weeks   ? Status New   ?  ? PT SHORT TERM GOAL #4  ? Title Gait pattern to improve including improved step lengths and increased stance time, at least 50% reduction in shuffling gait pattern   ? Time 4   ? Period Weeks   ? Status New   ?  ? PT SHORT TERM GOAL #5  ? Title Patient and spouse will be able to list at least 3 ways to reduce fall risk at home and in the community   ? Time 4   ? Period Weeks   ? Status New   ? ?  ?  ? ?  ? ? ? ? PT Long Term Goals - 02/28/22 1206   ? ?  ? PT LONG TERM GOAL #1  ? Title MMT to improve by at least 1 grade in all weak groups   ? Status On-going   ?  ? PT LONG TERM GOAL #2  ? Title Berg score to improve to at least 45 in order to show reduced fall risk/improved functional balance   ?  Status On-going   ?  ? PT LONG TERM GOAL #3  ? Title Will be able to complete floor to stand transfers with no more than MinA from spouse and safe technique   ? Status On-going   ? ?  ?  ? ?  ? ? ? ? ? ? ? ? Plan - 03/04/22 1022   ? ? Clinical Impression Statement [name] arrives today doing OK.  We focused on functional activity tolerance this session. Still needed a lot of visual cues today. O2 kept dropping into the 80s so we took lots of breaks to recover with PLB on room air today. Did well overall, I think he is starting to tolerate a bit more. Family feels he is more alert and engaged than before PT started. Will continue efforts.  Walking sat test 95% at rest, 84% after 552f, up to 90% after 2 min.   ? Personal Factors and Comorbidities Age;Fitness;Behavior Pattern;Comorbidity 3+;Time since onset of injury/illness/exacerbation   ? Examination-Activity Limitations Locomotion Level;Transfers;Bed Mobility;Squat;Stairs;Stand   ? Examination-Participation Restrictions Community Activity;Shop;Laundry;Volunteer;[employer] LeaverWork   ? Stability/Clinical Decision Making Stable/Uncomplicated   ? Clinical Decision Making Low   ? Rehab Potential Good   ? PT Frequency 2x / week   ? PT Duration 8 weeks   ? PT Treatment/Interventions ADLs/Self Care Home Management;Gait training;Therapeutic activities;Therapeutic exercise;Manual techniques;Neuromuscular re-education   ? PT Next Visit Plan needs to wear O2 in PT as he consistently desaturates with activity on RA. focus on balance, strength, functional activity tolerance; Be mindful of O2, need to check with exercise per MD needs to be at least 88%  (uses O2 PRN)   ? PT [place]  ? ?  ?  ? ?  ? ? ?  Patient will benefit from skilled therapeutic intervention in order to improve the following deficits and impairments:  Abnormal gait, Decreased coordination, Difficulty walking, Decreased endurance, Decreased safety awareness, Decreased activity tolerance, Decreased  knowledge of precautions, Impaired vision/preception, Decreased balance, Decreased knowledge of use of DME, Decreased cognition, Decreased mobility, Decreased strength, Postural dysfunction ? ?Visit Diagnosis: ?Repeated falls ? ?Unsteadiness on feet ? ?Difficulty in walking, not elsewhere classified ? ?Muscle weakness (generalized) ? ? ? ? ?Problem List ?Patient Active Problem List  ? Diagnosis Date Noted  ? Chronic heart failure with preserved ejection fraction ([place]) 03/25/2021  ? Prostate cancer ([place]) 02/06/2021  ? 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 Bilateral hearing loss 03/20/2020  ? Chronic respiratory failure with hypoxia ([place]) 07/20/2019  ? DOE (dyspnea on exertion) 09/03/2017  ? Prediabetes 09/03/2017  ? Personal history of colonic polyps 07/17/2017  ? Tobacco abuse, in remission 07/01/2017  ? GERD (gastroesophageal reflux disease) 06/26/2017  ? Chest pain 06/25/2017  ? Dizziness 01/07/2017  ? Pure hypercholesterolemia 08/27/2016  ? BRBPR (bright red blood per rectum) 08/27/2016  ? Age-related osteoporosis without current pathological fracture 08/07/2016  ? Mixed hyperlipidemia 02/01/2016  ? Leg pain, left 01/12/2016  ? Coronary artery disease involving coronary bypass graft of native heart with angina pectoris ([place]) 06/13/2015  ? Alzheimer's disease ([place]) 04/26/2015  ? 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 artery ([place]) 04/26/2015  ? 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 artery ([place]) 04/26/2015  ? 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 Palpitations 04/03/2015  ? S/P CABG (coronary artery bypass graft) 04/03/2015  ? Blind left eye 12/27/2014  ? Encounter for general adult medical examination without abnormal findings 12/27/2014  ? Small vessel disease, cerebrovascular 11/02/2014  ? Arteriovenous malformation of colon 06/25/2014  ? Benign localized hyperplasia of prostate with urinary obstruction 06/25/2014  ? Chronic kidney  disease (CKD), stage III (moderate) ([place]) 06/25/2014  ? COPD (chronic obstructive pulmonary disease) ([place]) 06/25/2014  ? Essential hypertension 06/25/2014  ? Abdominal aortic aneurysm, without rupture, unspecified

## 2022-03-06 ENCOUNTER — Ambulatory Visit: Payer: Medicare PPO | Admitting: Physical Therapy

## 2022-03-06 ENCOUNTER — Encounter: Payer: Self-pay | Admitting: Physical Therapy

## 2022-03-06 DIAGNOSIS — R2681 Unsteadiness on feet: Secondary | ICD-10-CM

## 2022-03-06 DIAGNOSIS — R296 Repeated falls: Secondary | ICD-10-CM

## 2022-03-06 DIAGNOSIS — M6281 Muscle weakness (generalized): Secondary | ICD-10-CM

## 2022-03-06 DIAGNOSIS — R262 Difficulty in walking, not elsewhere classified: Secondary | ICD-10-CM

## 2022-03-06 NOTE — Therapy (Signed)
[place] ?[address] [place] ?[address] ?[place], [place], [address] ?Phone: [phone]   Fax:  [phone] ? ?Physical Therapy Treatment ? ?Patient Details  ?Name: Juan Montoya ?MRN: [id number] ?Date of Birth: [birth year] ?Referring Provider (PT): Bernerd Limbo ? ? ?Encounter Date: 03/06/2022 ? ? PT End of Session - 03/06/22 1154   ? ? Visit Number 7   ? Number of Visits 17   ? Date for PT Re-Evaluation 04/10/22   ? Authorization Type [employer]   ? Authorization Time Period 02/13/22 to 04/10/22   ? Progress Note Due on Visit 10   ? PT Start Time [phone]   ? PT Stop Time 1014   ? PT Time Calculation (min) 41 min   ? Equipment Utilized During Treatment Gait belt   ? Activity Tolerance Patient tolerated treatment well   ? Behavior During Therapy [facility] for tasks assessed/performed   ? ?  ?  ? ?  ? ? ?Past Medical History:  ?Diagnosis Date  ? AAA (abdominal aortic aneurysm) ([place])   ? Abdominal aortic aneurysm (AAA) 3.0 cm to 5.5 cm in diameter in male [facility])   ? Cancer [facility])   ? prostate cancer grade 7   ? Carotid artery occlusion   ? Chronic kidney disease   ? Coronary artery disease   ? Dementia ([place])   ? GERD (gastroesophageal reflux disease)   ? Hyperlipidemia   ? Hypertension   ? Osteoporosis   ? Peripheral vascular disease ([place])   ? S/P colonoscopy 01/12/12  ? Stroke [facility])   ? ? ?Past Surgical History:  ?Procedure Laterality Date  ? CARDIAC CATHETERIZATION  05/2015  ? CORONARY ARTERY BYPASS GRAFT  2009  ? 4 Vessel  ? MASTOIDECTOMY    ? PROSTATE SURGERY    ? ? ?There were no vitals filed for this visit. ? ? Subjective Assessment - 03/06/22 0933   ? ? Subjective Patient reports no new issues.   ? Patient is accompained by: Family member   ? Patient Stated Goals reduce falls, get stronger, get more active   ? Currently in Pain? No/denies   ? ?  ?  ? ?  ? ? ? ? ? ? ? ? ? ? ? ? ? ? ? ? ? ? ? ? [place] Adult PT Treatment/Exercise - 03/06/22 0001   ? ?  ? Ambulation/Gait  ? Gait Comments  Ambulated 2 x 150' with supplemental O2, therapist encouraged slightly quicker gait and reach with the heels, with much improved foot clearance. Also cued for breathing, and patient was able to maintain SATS > 92% Included multiple turns on second bout of gait with no unsteadiness.   ?  ? Knee/Hip Exercises: Aerobic  ? Nustep L5x6 minutes   ?  ? Knee/Hip Exercises: Standing  ? Other Standing Knee Exercises Quick side to side steps x 60 seconds. Required VC for breathing and to pick feet up, good speed.         Repeated, sidestepping up onto and off airex pad   ? Other Standing Knee Exercises Heel raises on 4" step x 20 reps.   ? ?  ?  ? ?  ? ? ? ? ? ? ? ? ? ? ? ? PT Short Term Goals - 03/06/22 1316   ? ?  ? PT SHORT TERM GOAL #1  ? Title Will be compliant with appropriate progressive HEP with MinA from spouse   ? Baseline Initiated   ?  Time 3   ? Period Weeks   ? Status Achieved   ? Target Date 03/13/22   ?  ? PT SHORT TERM GOAL #2  ? Title Will be able to complete functional sit to stand transfers with no UEs and no more than min guard assist on 8/10 attempts   ? Baseline met   ? Time 4   ? Period Weeks   ? Status Achieved   ?  ? PT SHORT TERM GOAL #3  ? Title Will be able to complete bed mobility with no more than min guard assist   ? Time 4   ? Period Weeks   ? Status New   ?  ? PT SHORT TERM GOAL #4  ? Title Gait pattern to improve including improved step lengths and increased stance time, at least 50% reduction in shuffling gait pattern   ? Baseline Inconsistent.   ? Time 2   ? Period Weeks   ? Status On-going   ?  ? PT SHORT TERM GOAL #5  ? Title Patient and spouse will be able to list at least 3 ways to reduce fall risk at home and in the community   ? Time 4   ? Period Weeks   ? Status New   ? ?  ?  ? ?  ? ? ? ? PT Long Term Goals - 02/28/22 1206   ? ?  ? PT LONG TERM GOAL #1  ? Title MMT to improve by at least 1 grade in all weak groups   ? Status On-going   ?  ? PT LONG TERM GOAL #2  ? Title Berg score to  improve to at least 45 in order to show reduced fall risk/improved functional balance   ? Status On-going   ?  ? PT LONG TERM GOAL #3  ? Title Will be able to complete floor to stand transfers with no more than MinA from spouse and safe technique   ? Status On-going   ? ?  ?  ? ?  ? ? ? ? ? ? ? ? Plan - 03/06/22 1314   ? ? Clinical Impression Statement [name] arrived with O2, utilizing it during any aerobically taxing activities, still needed frequent cues for breathing, but able to maintain SATS. Treatment continued to focus on strength, balance, and gait.   ? Personal Factors and Comorbidities Fitness;Behavior Pattern;Comorbidity 3+;Time since onset of injury/illness/exacerbation;Age   ? Examination-Activity Limitations Locomotion Level;Transfers;Bed Mobility;Squat;Stairs;Stand   ? Examination-Participation Restrictions Community Activity;Shop;Laundry;Volunteer;[employer] Work   ? Stability/Clinical Decision Making Stable/Uncomplicated   ? Clinical Decision Making Low   ? Rehab Potential Good   ? PT Frequency 2x / week   ? PT Duration 8 weeks   ? PT Treatment/Interventions ADLs/Self Care Home Management;Gait training;Therapeutic activities;Therapeutic exercise;Manual techniques;Neuromuscular re-education   ? PT Next Visit Plan needs to wear O2 in PT as he consistently desaturates with activity on RA. focus on balance, strength, functional activity tolerance; Be mindful of O2, need to check with exercise per MD needs to be at least 88%  (uses O2 PRN)   ? PT [place]   ? Consulted and Agree with Plan of Care Patient;Family member/caregiver   ? ?  ?  ? ?  ? ? ?Patient will benefit from skilled therapeutic intervention in order to improve the following deficits and impairments:  Abnormal gait, Decreased coordination, Difficulty walking, Decreased endurance, Decreased safety awareness, Decreased activity tolerance, Decreased knowledge of precautions, Impaired  vision/preception, Decreased balance,  Decreased knowledge of use of DME, Decreased cognition, Decreased mobility, Decreased strength, Postural dysfunction ? ?Visit Diagnosis: ?Repeated falls ? ?Unsteadiness on feet ? ?Difficulty in walking, not elsewhere classified ? ?Muscle weakness (generalized) ? ? ? ? ?Problem List ?Patient Active Problem List  ? Diagnosis Date Noted  ? Chronic heart failure with preserved ejection fraction ([place]) 03/25/2021  ? Prostate cancer ([place]) 02/06/2021  ? 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 Bilateral hearing loss 03/20/2020  ? Chronic respiratory failure with hypoxia ([place]) 07/20/2019  ? DOE (dyspnea on exertion) 09/03/2017  ? Prediabetes 09/03/2017  ? Personal history of colonic polyps 07/17/2017  ? Tobacco abuse, in remission 07/01/2017  ? GERD (gastroesophageal reflux disease) 06/26/2017  ? Chest pain 06/25/2017  ? Dizziness 01/07/2017  ? Pure hypercholesterolemia 08/27/2016  ? BRBPR (bright red blood per rectum) 08/27/2016  ? Age-related osteoporosis without current pathological fracture 08/07/2016  ? Mixed hyperlipidemia 02/01/2016  ? Leg pain, left 01/12/2016  ? Coronary artery disease involving coronary bypass graft of native heart with angina pectoris ([place]) 06/13/2015  ? Alzheimer's disease ([place]) 04/26/2015  ? 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 artery ([place]) 04/26/2015  ? 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 artery ([place]) 04/26/2015  ? 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 Palpitations 04/03/2015  ? S/P CABG (coronary artery bypass graft) 04/03/2015  ? Blind left eye 12/27/2014  ? Encounter for general adult medical examination without abnormal findings 12/27/2014  ? Small vessel disease, cerebrovascular 11/02/2014  ? Arteriovenous malformation of colon 06/25/2014  ? Benign localized hyperplasia of prostate with urinary obstruction 06/25/2014  ? Chronic kidney disease (CKD), stage III (moderate) ([place]) 06/25/2014  ? COPD (chronic  obstructive pulmonary disease) ([place]) 06/25/2014  ? Essential hypertension 06/25/2014  ? Abdominal aortic aneurysm, without rupture, unspecified ([place]) 06/25/2014  ? Benign neoplasm of colon 06/25/2014  ? Carotid artery st

## 2022-03-11 ENCOUNTER — Ambulatory Visit: Payer: Medicare PPO | Admitting: Physical Therapy

## 2022-03-11 ENCOUNTER — Encounter: Payer: Self-pay | Admitting: Physical Therapy

## 2022-03-11 DIAGNOSIS — M6281 Muscle weakness (generalized): Secondary | ICD-10-CM

## 2022-03-11 DIAGNOSIS — R296 Repeated falls: Secondary | ICD-10-CM

## 2022-03-11 DIAGNOSIS — R262 Difficulty in walking, not elsewhere classified: Secondary | ICD-10-CM

## 2022-03-11 NOTE — Therapy (Signed)
[facility] ?[place] [address] ?[place], [place], [address] ?Phone: [phone]   Fax:  [phone] ? ?Physical Therapy Treatment ? ?Patient Details  ?Name: Juan Montoya ?MRN: [id number] ?Date of Birth: 09-04-1941 ?Referring Provider (PT): Bernerd Limbo ? ? ?Encounter Date: 03/11/2022 ? ? PT End of Session - 03/11/22 1015   ? ? Visit Number 8   ? Number of Visits 17   ? Date for PT Re-Evaluation 04/10/22   ? Authorization Type [employer]   ? Authorization Time Period 02/13/22 to 04/10/22   ? Progress Note Due on Visit 10   ? PT Start Time [phone]   ? PT Stop Time 1015   ? PT Time Calculation (min) 44 min   ? Equipment Utilized During Treatment Gait belt   ? Activity Tolerance Patient tolerated treatment well   ? Behavior During Therapy [facility] for tasks assessed/performed   ? ?  ?  ? ?  ? ? ?Past Medical History:  ?Diagnosis Date  ? AAA (abdominal aortic aneurysm) ([place])   ? Abdominal aortic aneurysm (AAA) 3.0 cm to 5.5 cm in diameter in male [facility])   ? Cancer [facility])   ? prostate cancer grade 7   ? Carotid artery occlusion   ? Chronic kidney disease   ? Coronary artery disease   ? Dementia ([place])   ? GERD (gastroesophageal reflux disease)   ? Hyperlipidemia   ? Hypertension   ? Osteoporosis   ? Peripheral vascular disease ([place])   ? S/P colonoscopy 01/12/12  ? Stroke [facility])   ? ? ?Past Surgical History:  ?Procedure Laterality Date  ? CARDIAC CATHETERIZATION  05/2015  ? CORONARY ARTERY BYPASS GRAFT  2009  ? 4 Vessel  ? MASTOIDECTOMY    ? PROSTATE SURGERY    ? ? ?There were no vitals filed for this visit. ? ? Subjective Assessment - 03/11/22 0934   ? ? Subjective Patient reports no issues, no pain.   ? Patient is accompained by: Family member   ? Currently in Pain? No/denies   ? ?  ?  ? ?  ? ? ? ? ? ? ? ? ? ? ? ? ? ? ? ? ? ? ? ? [place] Adult PT Treatment/Exercise - 03/11/22 0001   ? ?  ? Transfers  ? Transfers Floor to Transfer   ? Floor to Transfer 4: Min guard   Patient practiced twice,  required mod A to move sit to stand, but when he rolled oiver to porne and moved to hands and knees then up, CGA.  ?  ? Ambulation/Gait  ? Gait Comments Ambulated 1 x 275' with supplemental O2, therapist encouraged slightly quicker gait and reach with the heels, with much improved foot clearance. Also cued for breathing, and patient was able to maintain SATS > 92%   ?  ? Knee/Hip Exercises: Aerobic  ? Nustep L5x6 minutes   ?  ? Knee/Hip Exercises: Standing  ? Other Standing Knee Exercises Single limb stance-alternate step taps on large based cones, progressed to moving from one to another, forcing rotation over stance limb. VC for a strong trunk and deep breathing, CGA.   ?  ? Knee/Hip Exercises: Seated  ? Sit to Sand 1 set;10 reps;with UE support   VC for deep breathing  ? ?  ?  ? ?  ? ? ? ? ? ? ? ? ? ? ? ? PT Short Term Goals - 03/06/22 1316   ? ?  ?  PT SHORT TERM GOAL #1  ? Title Will be compliant with appropriate progressive HEP with MinA from spouse   ? Baseline Initiated   ? Time 3   ? Period Weeks   ? Status Achieved   ? Target Date 03/13/22   ?  ? PT SHORT TERM GOAL #2  ? Title Will be able to complete functional sit to stand transfers with no UEs and no more than min guard assist on 8/10 attempts   ? Baseline met   ? Time 4   ? Period Weeks   ? Status Achieved   ?  ? PT SHORT TERM GOAL #3  ? Title Will be able to complete bed mobility with no more than min guard assist   ? Time 4   ? Period Weeks   ? Status New   ?  ? PT SHORT TERM GOAL #4  ? Title Gait pattern to improve including improved step lengths and increased stance time, at least 50% reduction in shuffling gait pattern   ? Baseline Inconsistent.   ? Time 2   ? Period Weeks   ? Status On-going   ?  ? PT SHORT TERM GOAL #5  ? Title Patient and spouse will be able to list at least 3 ways to reduce fall risk at home and in the community   ? Time 4   ? Period Weeks   ? Status New   ? ?  ?  ? ?  ? ? ? ? PT Long Term Goals - 03/11/22 1006   ? ?  ? PT LONG  TERM GOAL #1  ? Title MMT to improve by at least 1 grade in all weak groups   ? Status On-going   ?  ? PT LONG TERM GOAL #2  ? Title Berg score to improve to at least 45 in order to show reduced fall risk/improved functional balance   ? Status On-going   ?  ? PT LONG TERM GOAL #3  ? Title Will be able to complete floor to stand transfers with no more than MinA from spouse and safe technique   ? Baseline CGA   ? Status Achieved   ? ?  ?  ? ?  ? ? ? ? ? ? ? ? Plan - 03/11/22 1016   ? ? Clinical Impression Statement Patient reports no changes in his status. He participated well, and demosntrates progress toward LTG. He practiced floor>stand transfers, able to accomplish with CGA.   ? Personal Factors and Comorbidities Fitness;Behavior Pattern;Comorbidity 3+;Time since onset of injury/illness/exacerbation;Age   ? Examination-Activity Limitations Locomotion Level;Transfers;Bed Mobility;Squat;Stairs;Stand   ? Examination-Participation Restrictions Community Activity;Shop;Laundry;Volunteer;[employer] Work   ? Stability/Clinical Decision Making Stable/Uncomplicated   ? Clinical Decision Making Low   ? Rehab Potential Good   ? PT Frequency 2x / week   ? PT Duration 4 weeks   ? PT Treatment/Interventions ADLs/Self Care Home Management;Gait training;Therapeutic activities;Therapeutic exercise;Manual techniques;Neuromuscular re-education   ? PT Next Visit Plan needs to wear O2 in PT as he consistently desaturates with activity on RA. focus on balance, strength, functional activity tolerance; Be mindful of O2, need to check with exercise per MD needs to be at least 88%  (uses O2 PRN)   ? PT [place]   ? Consulted and Agree with Plan of Care Patient;Family member/caregiver   ? ?  ?  ? ?  ? ? ?Patient will benefit from skilled therapeutic intervention in order to improve  the following deficits and impairments:  Abnormal gait, Decreased coordination, Difficulty walking, Decreased endurance, Decreased safety awareness,  Decreased activity tolerance, Decreased knowledge of precautions, Impaired vision/preception, Decreased balance, Decreased knowledge of use of DME, Decreased cognition, Decreased mobility, Decreased strength, Postural dysfunction ? ?Visit Diagnosis: ?Repeated falls ? ?Difficulty in walking, not elsewhere classified ? ?Muscle weakness (generalized) ? ? ? ? ?Problem List ?Patient Active Problem List  ? Diagnosis Date Noted  ? Chronic heart failure with preserved ejection fraction ([place]) 03/25/2021  ? Prostate cancer ([place]) 02/06/2021  ? 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 Bilateral hearing loss 03/20/2020  ? Chronic respiratory failure with hypoxia ([place]) 07/20/2019  ? DOE (dyspnea on exertion) 09/03/2017  ? Prediabetes 09/03/2017  ? Personal history of colonic polyps 07/17/2017  ? Tobacco abuse, in remission 07/01/2017  ? GERD (gastroesophageal reflux disease) 06/26/2017  ? Chest pain 06/25/2017  ? Dizziness 01/07/2017  ? Pure hypercholesterolemia 08/27/2016  ? BRBPR (bright red blood per rectum) 08/27/2016  ? Age-related osteoporosis without current pathological fracture 08/07/2016  ? Mixed hyperlipidemia 02/01/2016  ? Leg pain, left 01/12/2016  ? Coronary artery disease involving coronary bypass graft of native heart with angina pectoris ([place]) 06/13/2015  ? Alzheimer's disease ([place]) 04/26/2015  ? 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 artery ([place]) 04/26/2015  ? 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 Palpitations 04/03/2015  ? S/P CABG (coronary artery bypass graft) 04/03/2015  ? Blind left eye 12/27/2014  ? Encounter for general adult medical examination without abnormal findings 12/27/2014  ? Small vessel disease, cerebrovascular 11/02/2014  ? Arteriovenous malformation of colon 06/25/2014  ? Benign localized hyperplasia of prostate with urinary obstruction 06/25/2014  ?  Chronic kidney disease (CKD), stage III (moderate) ([place]) 06/25/2014  ? COPD (chronic obstructive pulmonary disease) ([place]) 06/25/2014  ? Essential hypertension 06/25/2014  ? Abdominal aortic aneurysm, w

## 2022-03-13 ENCOUNTER — Ambulatory Visit: Payer: Medicare PPO | Admitting: Physical Therapy

## 2022-03-13 ENCOUNTER — Encounter: Payer: Self-pay | Admitting: Physical Therapy

## 2022-03-13 DIAGNOSIS — R262 Difficulty in walking, not elsewhere classified: Secondary | ICD-10-CM

## 2022-03-13 DIAGNOSIS — R2681 Unsteadiness on feet: Secondary | ICD-10-CM

## 2022-03-13 DIAGNOSIS — R296 Repeated falls: Secondary | ICD-10-CM | POA: Diagnosis not present

## 2022-03-13 DIAGNOSIS — M6281 Muscle weakness (generalized): Secondary | ICD-10-CM

## 2022-03-13 NOTE — Therapy (Signed)
[place] [address] ?[place], [place], [address] ?Phone: [phone]   Fax:  [phone] ? ?Physical Therapy Treatment ? ?Patient Details  ?Name: Juan Montoya ?MRN: [id number] ?Date of Birth: [birth year] ?Referring Provider (PT): Bernerd Limbo ? ? ?Encounter Date: 03/13/2022 ? ?Progress Note ?Reporting Period 02/13/22 to 03/13/22 ? ?See note below for Objective Data and Assessment of Progress/Goals.  ? ?  ? ? PT End of Session - 03/13/22 1031   ? ? Visit Number 9   ? Number of Visits 17   ? Date for PT Re-Evaluation 04/10/22   ? Authorization Type [employer]   ? Authorization Time Period 02/13/22 to 04/10/22   ? Progress Note Due on Visit 19   ? PT Start Time [phone]   ? PT Stop Time 1014   ? PT Time Calculation (min) 40 min   ? Activity Tolerance Patient tolerated treatment well   ? Behavior During Therapy [facility] for tasks assessed/performed   ? ?  ?  ? ?  ? ? ?Past Medical History:  ?Diagnosis Date  ? AAA (abdominal aortic aneurysm) ([place])   ? Abdominal aortic aneurysm (AAA) 3.0 cm to 5.5 cm in diameter in male [facility])   ? Cancer [facility])   ? prostate cancer grade 7   ? Carotid artery occlusion   ? Chronic kidney disease   ? Coronary artery disease   ? Dementia ([place])   ? GERD (gastroesophageal reflux disease)   ? Hyperlipidemia   ? Hypertension   ? Osteoporosis   ? Peripheral vascular disease ([place])   ? S/P colonoscopy 01/12/12  ? Stroke [facility])   ? ? ?Past Surgical History:  ?Procedure Laterality Date  ? CARDIAC CATHETERIZATION  05/2015  ? CORONARY ARTERY BYPASS GRAFT  2009  ? 4 Vessel  ? MASTOIDECTOMY    ? PROSTATE SURGERY    ? ? ?There were no vitals filed for this visit. ? ? Subjective Assessment - 03/13/22 0936   ? ? Subjective Per daughter: We want to get measurements today and just see where things are at and what would be most beneficial for him. It depends on the day as to how he does. He's very sedentary and he always  has been unless he was at work. Had a fall in the  kitchen on Monday afternoon, I was not in the room but I did hear the fall and found him on the floor, he told me his feet got tangled up.   ? Patient is accompained by: Family member   ? Patient Stated Goals reduce falls, get stronger, get more active   ? Currently in Pain? No/denies   ? ?  ?  ? ?  ? ? ? ? ? [facility] PT Assessment - 03/13/22 0001   ? ?  ? Assessment  ? Medical Diagnosis falls   ? Referring Provider (PT) Bernerd Limbo   ? Onset Date/Surgical Date --   chronic  ? Next MD Visit Dr. Coletta Memos in 6 months or PRN   ? Prior Therapy none   ?  ? Precautions  ? Precautions Fall;Other (comment)   ? Precaution Comments HOH, blind L eye, needs O2 intermittently   ?  ? Restrictions  ? Weight Bearing Restrictions No   ?  ? Balance Screen  ? Has the patient fallen in the past 6 months Yes   ? How many times? 4   ? Has the patient had a decrease in activity level  because of a fear of falling?  Yes   ? Is the patient reluctant to leave their home because of a fear of falling?  No   ?  ? Home Environment  ? Living Environment Private residence   ?  ? Prior Function  ? Level of Independence Independent;Independent with basic ADLs;Independent with gait;Independent with transfers   ? Vocation Retired   ? Leisure going to [employer] baseball games, going for rides on weekends   ?  ? Strength  ? Overall Strength Comments question accuracy of MMT due to cognition/command following   ? Right Hip Flexion 3/5   ? Left Hip Flexion 3+/5   ? Right Knee Flexion 3/5   ? Right Knee Extension 4+/5   ? Left Knee Flexion 3+/5   ? Left Knee Extension 5/5   ? Right Ankle Dorsiflexion 5/5   ? Left Ankle Dorsiflexion 5/5   ?  ? 6 minute walk test results   ? Endurance additional comments 722f 3MWT O2 drop to 86% on 2LPM   ?  ? Berg Balance Test  ? Sit to Stand Able to stand  independently using hands   ? Standing Unsupported Able to stand safely 2 minutes   ? Sitting with Back Unsupported but Feet Supported on Floor or Stool Able to sit safely  and securely 2 minutes   ? Stand to Sit Controls descent by using hands   ? Transfers Able to transfer safely, definite need of hands   ? Standing Unsupported with Eyes Closed Able to stand 10 seconds with supervision   ? Standing Unsupported with Feet Together Able to place feet together independently and stand for 1 minute with supervision   ? From Standing, Reach Forward with Outstretched Arm Can reach forward >5 cm safely (2")   ? From Standing Position, Pick up Object from FLequireto pick up shoe safely and easily   ? From Standing Position, Turn to Look Behind Over each Shoulder Looks behind one side only/other side shows less weight shift   ? Turn 360 Degrees Able to turn 360 degrees safely but slowly   ? Standing Unsupported, Alternately Place Feet on Step/Stool Able to complete >2 steps/needs minimal assist   ? Standing Unsupported, One Foot in FONEOKbalance while stepping or standing   ? Standing on One Leg Unable to try or needs assist to prevent fall   ? Total Score 35   ? BMerrilee Janskycomment: I thnk this was also possibly affected by command following/cognition   ? ?  ?  ? ?  ? ? ? ? ? ? ? ? ? ? ? ? ? ? ? ? ? ? ? ? ? ? ? ? ? PT Education - 03/13/22 1030   ? ? Education Details progress as well as potential benefit of an additional 4 weeks of therapy transitioning to adult day care to help increase activity levels, reduce unsupervised falls at home,  and prevent sedentary lifestyle after DC from therapy. O2 at all times with activity due to rapid desats on RA   ? Person(s) Educated Patient;Child(ren)   ? Methods Explanation   ? Comprehension Verbalized understanding   ? ?  ?  ? ?  ? ? ? PT Short Term Goals - 03/13/22 1004   ? ?  ? PT SHORT TERM GOAL #1  ? Title Will be compliant with appropriate progressive HEP with MinA from spouse   ? Baseline "does what I ask him to  do but doesn't like it"   ? Time 3   ? Period Weeks   ? Status Achieved   ?  ? PT SHORT TERM GOAL #2  ? Title Will be able to complete  functional sit to stand transfers with no UEs and no more than min guard assist on 8/10 attempts   ? Time 4   ? Period Weeks   ? Status Achieved   ?  ? PT SHORT TERM GOAL #3  ? Title Will be able to complete bed mobility with no more than min guard assist   ? Baseline "I see some improvement, does not need any physical assist"   ? Time 4   ? Period Weeks   ? Status Achieved   ?  ? PT SHORT TERM GOAL #4  ? Title Gait pattern to improve including improved step lengths and increased stance time, at least 50% reduction in shuffling gait pattern   ? Baseline Inconsistent secondary to cognition   ? Time 2   ? Period Weeks   ? Status On-going   ?  ? PT SHORT TERM GOAL #5  ? Title Patient and spouse will be able to list at least 3 ways to reduce fall risk at home and in the community   ? Baseline able to name 2/3 ways   ? Time 4   ? Period Weeks   ? Status Partially Met   ? ?  ?  ? ?  ? ? ? ? PT Long Term Goals - 03/13/22 1008   ? ?  ? PT LONG TERM GOAL #1  ? Title MMT to improve by at least 1 grade in all weak groups   ? Baseline hard to assess due to cognition   ? Time 8   ? Period Weeks   ? Status Deferred   ?  ? PT LONG TERM GOAL #2  ? Title Berg score to improve to at least 45 in order to show reduced fall risk/improved functional balance   ? Time 8   ? Period Weeks   ? Status On-going   ?  ? PT LONG TERM GOAL #3  ? Title Will be able to complete floor to stand transfers with no more than MinA from spouse and safe technique   ? Baseline CGA   ? Time 8   ? Status Achieved   ? ?  ?  ? ?  ? ? ? ? ? ? ? ? Plan - 03/13/22 1031   ? ? Clinical Impression Statement I performed objective reassessment on [name] today per his daughter?s request. Objective measures do not show huge improvement but to be fair I feel like these were very much complicated by cognition and command following. His O2 frequently drops into the high 70s and low 80s with activity on room air and it takes him a long time to recovery with max cues for PLB, I  still feel he needs to have O2 for activity. We spent a lot of time discussing progress as well as potential benefit of an additional 4 weeks of therapy transitioning to adult day care to help increase

## 2022-03-18 ENCOUNTER — Encounter: Payer: Self-pay | Admitting: Physical Therapy

## 2022-03-18 ENCOUNTER — Ambulatory Visit: Payer: Medicare PPO | Admitting: Physical Therapy

## 2022-03-18 DIAGNOSIS — R296 Repeated falls: Secondary | ICD-10-CM | POA: Diagnosis not present

## 2022-03-18 DIAGNOSIS — R2681 Unsteadiness on feet: Secondary | ICD-10-CM

## 2022-03-18 DIAGNOSIS — M6281 Muscle weakness (generalized): Secondary | ICD-10-CM

## 2022-03-18 DIAGNOSIS — R262 Difficulty in walking, not elsewhere classified: Secondary | ICD-10-CM

## 2022-03-18 NOTE — Therapy (Signed)
[place] [place], [place], [address] Phone: [phone]   Fax:  [phone]  Physical Therapy Treatment  Patient Details  Name: Juan Montoya MRN: [id number] Date of Birth: 18-Oct-1941 Referring Provider (PT): Bernerd Limbo   Encounter Date: 03/18/2022   PT End of Session - 03/18/22 0927     Visit Number 10    Number of Visits 17    Date for PT Re-Evaluation 04/10/22    Authorization Type [employer]    Authorization Time Period 02/13/22 to 04/10/22    Progress Note Due on Visit 57    PT Start Time 0846    PT Stop Time 0927    PT Time Calculation (min) 41 min    Activity Tolerance Patient tolerated treatment well    Behavior During Therapy [facility] for tasks assessed/performed             Past Medical History:  Diagnosis Date   AAA (abdominal aortic aneurysm) ([place])    Abdominal aortic aneurysm (AAA) 3.0 cm to 5.5 cm in diameter in male [facility])    Cancer ([place])    prostate cancer grade 7    Carotid artery occlusion    Chronic kidney disease    Coronary artery disease    Dementia ([place])    GERD (gastroesophageal reflux disease)    Hyperlipidemia    Hypertension    Osteoporosis    Peripheral vascular disease ([place])    S/P colonoscopy 01/12/12   Stroke [facility])     Past Surgical History:  Procedure Laterality Date   CARDIAC CATHETERIZATION  05/2015   CORONARY ARTERY BYPASS GRAFT  2009   4 Vessel   MASTOIDECTOMY     PROSTATE SURGERY      There were no vitals filed for this visit.   Subjective Assessment - 03/18/22 0849     Subjective Per daughter: I don't think he's as good as he was last week. He had a fall upstairs last week and he thought I didn't know. Everything else is about the same.    Patient is accompained by: Family member    Patient Stated Goals reduce falls, get stronger, get more active    Currently in Pain? No/denies                               [facility] Adult PT  Treatment/Exercise - 03/18/22 0001       Knee/Hip Exercises: Aerobic   Nustep L5x6 minutes      Knee/Hip Exercises: Standing   Other Standing Knee Exercises farmers carry 8# [id number]                 Balance Exercises - 03/18/22 0001       Balance Exercises: Standing   Tandem Stance Eyes open;3 reps;30 secs    SLS Eyes open;3 reps;15 secs    Marching Foam/compliant surface;20 reps    Heel Raises 20 reps   on foam   Other Standing Exercises Comments external perturbations on foam pad with MinA for balance; alternating cone taps in // bars solid surface                PT Education - 03/18/22 0927     Education Details exercise form/purpose    Person(s) Educated Patient;Child(ren)    Methods Explanation    Comprehension Verbalized understanding              PT  Short Term Goals - 03/13/22 1004       PT SHORT TERM GOAL #1   Title Will be compliant with appropriate progressive HEP with MinA from spouse    Baseline "does what I ask him to do but doesn't like it"    Time 3    Period Weeks    Status Achieved      PT SHORT TERM GOAL #2   Title Will be able to complete functional sit to stand transfers with no UEs and no more than min guard assist on 8/10 attempts    Time 4    Period Weeks    Status Achieved      PT SHORT TERM GOAL #3   Title Will be able to complete bed mobility with no more than min guard assist    Baseline "I see some improvement, does not need any physical assist"    Time 4    Period Weeks    Status Achieved      PT SHORT TERM GOAL #4   Title Gait pattern to improve including improved step lengths and increased stance time, at least 50% reduction in shuffling gait pattern    Baseline Inconsistent secondary to cognition    Time 2    Period Weeks    Status On-going      PT SHORT TERM GOAL #5   Title Patient and spouse will be able to list at least 3 ways to reduce fall risk at home and in the community    Baseline able to name 2/3  ways    Time 4    Period Weeks    Status Partially Met               PT Long Term Goals - 03/13/22 1008       PT LONG TERM GOAL #1   Title MMT to improve by at least 1 grade in all weak groups    Baseline hard to assess due to cognition    Time 8    Period Weeks    Status Deferred      PT LONG TERM GOAL #2   Title Berg score to improve to at least 45 in order to show reduced fall risk/improved functional balance    Time 8    Period Weeks    Status On-going      PT LONG TERM GOAL #3   Title Will be able to complete floor to stand transfers with no more than MinA from spouse and safe technique    Baseline CGA    Time 8    Status Achieved                   Plan - 03/18/22 0927     Clinical Impression Statement Had a fall this weekend, no injuries, per daughter it seems to be more of a feet getting twisted up thing than anything. Warmed up on the nustep and then focused on balance today. Still needed a lot of visual cues to perform tasks correctly today. Seemed a lot more tired than his typical and needed a lot more physical assist than usual to complete tasks today.    Personal Factors and Comorbidities Fitness;Behavior Pattern;Comorbidity 3+;Time since onset of injury/illness/exacerbation;Age    Examination-Activity Limitations Locomotion Level;Transfers;Bed Mobility;Squat;Stairs;Stand    Examination-Participation Restrictions Community Activity;Shop;Laundry;Volunteer;Yard Work    Stability/Clinical Decision Making Stable/Uncomplicated    Clinical Decision Making Low    Rehab Potential Good    PT Frequency  2x / week    PT Duration 4 weeks    PT Treatment/Interventions ADLs/Self Care Home Management;Gait training;Therapeutic activities;Therapeutic exercise;Manual techniques;Neuromuscular re-education    PT Next Visit Plan needs to wear O2 in PT as he consistently desaturates with activity on RA. focus on balance, strength, functional activity tolerance; Be  mindful of O2, need to check with exercise per MD needs to be at least 88%  (uses O2 PRN)    PT Home Exercise Plan [id number]    Consulted and Agree with Plan of Care Patient;Family member/caregiver    Family Member Consulted daughter             Patient will benefit from skilled therapeutic intervention in order to improve the following deficits and impairments:  Abnormal gait, Decreased coordination, Difficulty walking, Decreased endurance, Decreased safety awareness, Decreased activity tolerance, Decreased knowledge of precautions, Impaired vision/preception, Decreased balance, Decreased knowledge of use of DME, Decreased cognition, Decreased mobility, Decreased strength, Postural dysfunction  Visit Diagnosis: Repeated falls  Muscle weakness (generalized)  Unsteadiness on feet  Difficulty in walking, not elsewhere classified     Problem List Patient Active Problem List   Diagnosis Date Noted   Chronic heart failure with preserved ejection fraction ([place]) 03/25/2021   Prostate cancer ([place]) 02/06/2021   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[place]) 07/26/2020   Bilateral hearing loss 03/20/2020   Chronic respiratory failure with hypoxia ([place]) 07/20/2019   DOE (dyspnea on exertion) 09/03/2017   Prediabetes 09/03/2017   Personal history of colonic polyps 07/17/2017   Tobacco abuse, in remission 07/01/2017   GERD (gastroesophageal reflux disease) 06/26/2017   Chest pain 06/25/2017   Dizziness 01/07/2017   Pure hypercholesterolemia 08/27/2016   BRBPR (bright red blood per rectum) 08/27/2016   Age-related osteoporosis without current pathological fracture 08/07/2016   Mixed hyperlipidemia 02/01/2016   Leg pain, left 01/12/2016   Coronary artery disease involving coronary bypass graft of native heart with angina pectoris ([place]) 06/13/2015   Alzheimer's disease ([place]) 04/26/2015   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 artery ([place]) 04/26/2015   Cerebral  infarction due to thrombosis of left middle cerebral artery ([place]) 04/26/2015   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   Palpitations 04/03/2015   S/P CABG (coronary artery bypass graft) 04/03/2015   Blind left eye 12/27/2014   Encounter for general adult medical examination without abnormal findings 12/27/2014   Small vessel disease, cerebrovascular 11/02/2014   Arteriovenous malformation of colon 06/25/2014   Benign localized hyperplasia of prostate with urinary obstruction 06/25/2014   Chronic kidney disease (CKD), stage III (moderate) ([place]) 06/25/2014   COPD (chronic obstructive pulmonary disease) ([place]) 06/25/2014   Essential hypertension 06/25/2014   Abdominal aortic aneurysm, without rupture, unspecified ([place]) 06/25/2014   Benign neoplasm of colon 06/25/2014   Carotid artery stenosis 06/25/2014   Centrilobular emphysema ([place]) 06/25/2014   Diverticulosis of intestine, part unspecified, without perforation or abscess without bleeding 06/25/2014   Hearing loss 06/25/2014   Ischemic colitis ([place]) 06/25/2014   Mixed conductive and sensorineural hearing loss of left ear with restricted hearing of right ear 06/25/2014   Nocturia 06/25/2014   Occlusion and stenosis of multiple and bilateral precerebral arteries 06/25/2014   Organic impotence 06/25/2014   Postmastoidectomy complication 22/57/5051   Testicular hypofunction 06/25/2014   Unilateral inguinal hernia, without obstruction or gangrene, not specified as recurrent 06/25/2014   Urinary urgency 06/25/2014   Visual impairment 06/25/2014   Occlusion and stenosis of carotid artery without mention of cerebral infarction 03/22/2014  Aneurysm of abdominal vessel ([place]) 03/17/2012   Ann Lions PT, DPT, PN2   Supplemental Physical Therapist [address] [place], [place], [address] Phone: [phone]   Fax:  [phone]  Name: Juan Montoya MRN: [id number] Date of Birth: 31-Aug-1941

## 2022-03-26 ENCOUNTER — Encounter: Payer: Self-pay | Admitting: Physical Therapy

## 2022-03-26 ENCOUNTER — Ambulatory Visit: Payer: Medicare PPO | Admitting: Physical Therapy

## 2022-03-26 DIAGNOSIS — R262 Difficulty in walking, not elsewhere classified: Secondary | ICD-10-CM

## 2022-03-26 DIAGNOSIS — R2681 Unsteadiness on feet: Secondary | ICD-10-CM

## 2022-03-26 DIAGNOSIS — R296 Repeated falls: Secondary | ICD-10-CM

## 2022-03-26 DIAGNOSIS — M6281 Muscle weakness (generalized): Secondary | ICD-10-CM

## 2022-03-26 NOTE — Therapy (Signed)
[place]. [place], [place], [address] Phone: [phone]   Fax:  [phone]  Physical Therapy Treatment  Patient Details  Name: Juan Montoya MRN: [id number] Date of Birth: 04-17-1941 Referring Provider (PT): Bernerd Limbo   Encounter Date: 03/26/2022   PT End of Session - 03/26/22 1359     Visit Number 11    Number of Visits 17    Date for PT Re-Evaluation 04/10/22    Authorization Type [employer]    Authorization Time Period 02/13/22 to 04/10/22    Progress Note Due on Visit 19    PT Start Time 1316    PT Stop Time 1357    PT Time Calculation (min) 41 min    Activity Tolerance Patient tolerated treatment well    Behavior During Therapy [facility] for tasks assessed/performed             Past Medical History:  Diagnosis Date   AAA (abdominal aortic aneurysm) ([place])    Abdominal aortic aneurysm (AAA) 3.0 cm to 5.5 cm in diameter in male [facility])    Cancer ([place])    prostate cancer grade 7    Carotid artery occlusion    Chronic kidney disease    Coronary artery disease    Dementia ([place])    GERD (gastroesophageal reflux disease)    Hyperlipidemia    Hypertension    Osteoporosis    Peripheral vascular disease ([place])    S/P colonoscopy 01/12/12   Stroke [facility])     Past Surgical History:  Procedure Laterality Date   CARDIAC CATHETERIZATION  05/2015   CORONARY ARTERY BYPASS GRAFT  2009   4 Vessel   MASTOIDECTOMY     PROSTATE SURGERY      There were no vitals filed for this visit.   Subjective Assessment - 03/26/22 1319     Subjective Still really unsteady, having issues with multistep things like locking the door, turning and going out of the step. Still trying to get a rail put up but no one is returning our calls.    Patient is accompained by: Family member    Patient Stated Goals reduce falls, get stronger, get more active    Currently in Pain? No/denies                                [facility] Adult PT Treatment/Exercise - 03/26/22 0001       Knee/Hip Exercises: Aerobic   Nustep L5x6 minutes   BLEs only     Knee/Hip Exercises: Standing   Other Standing Knee Exercises dual tasking- walking while tossing ball up and down; backwards walking while tossing ball up and down; ladder sideways and forwards while answering simple math questions                 Balance Exercises - 03/26/22 0001       Balance Exercises: Standing   Marching Foam/compliant surface;20 reps    Heel Raises Both;20 reps                PT Education - 03/26/22 1359     Education Details need for O2 during PT to maintain sats with activity    Person(s) Educated Patient    Methods Explanation    Comprehension Verbalized understanding              PT Short Term Goals - 03/13/22 1004  PT SHORT TERM GOAL #1   Title Will be compliant with appropriate progressive HEP with MinA from spouse    Baseline "does what I ask him to do but doesn't like it"    Time 3    Period Weeks    Status Achieved      PT SHORT TERM GOAL #2   Title Will be able to complete functional sit to stand transfers with no UEs and no more than min guard assist on 8/10 attempts    Time 4    Period Weeks    Status Achieved      PT SHORT TERM GOAL #3   Title Will be able to complete bed mobility with no more than min guard assist    Baseline "I see some improvement, does not need any physical assist"    Time 4    Period Weeks    Status Achieved      PT SHORT TERM GOAL #4   Title Gait pattern to improve including improved step lengths and increased stance time, at least 50% reduction in shuffling gait pattern    Baseline Inconsistent secondary to cognition    Time 2    Period Weeks    Status On-going      PT SHORT TERM GOAL #5   Title Patient and spouse will be able to list at least 3 ways to reduce fall risk at home and in the community    Baseline able to  name 2/3 ways    Time 4    Period Weeks    Status Partially Met               PT Long Term Goals - 03/13/22 1008       PT LONG TERM GOAL #1   Title MMT to improve by at least 1 grade in all weak groups    Baseline hard to assess due to cognition    Time 8    Period Weeks    Status Deferred      PT LONG TERM GOAL #2   Title Berg score to improve to at least 45 in order to show reduced fall risk/improved functional balance    Time 8    Period Weeks    Status On-going      PT LONG TERM GOAL #3   Title Will be able to complete floor to stand transfers with no more than MinA from spouse and safe technique    Baseline CGA    Time 8    Status Achieved                   Plan - 03/26/22 1359     Clinical Impression Statement [name] arrives today with his daughter, they forgot O2 tank at home. We did what we could today with close O2 monitoring as he still tends to desat with activity on room air- often dropped to as low as 84- 85% with activity and needed extended rest with coached PLB to recover. Session limited as a result.  Will continue efforts.    Personal Factors and Comorbidities Fitness;Behavior Pattern;Comorbidity 3+;Time since onset of injury/illness/exacerbation;Age    Examination-Activity Limitations Locomotion Level;Transfers;Bed Mobility;Squat;Stairs;Stand    Examination-Participation Restrictions Community Activity;Shop;Laundry;Volunteer;Yard Work    Stability/Clinical Decision Making Stable/Uncomplicated    Designer, jewellery Low    Rehab Potential Good    PT Frequency 2x / week    PT Duration 4 weeks    PT Treatment/Interventions ADLs/Self Care Home  Management;Gait training;Therapeutic activities;Therapeutic exercise;Manual techniques;Neuromuscular re-education    PT Next Visit Plan needs to wear O2 in PT as he consistently desaturates with activity on RA. focus on balance, strength, functional activity tolerance; Be mindful of O2, need to check  with exercise per MD needs to be at least 88%  (uses O2 PRN)    PT Home Exercise Plan [id number]    Consulted and Agree with Plan of Care Patient;Family member/caregiver             Patient will benefit from skilled therapeutic intervention in order to improve the following deficits and impairments:  Abnormal gait, Decreased coordination, Difficulty walking, Decreased endurance, Decreased safety awareness, Decreased activity tolerance, Decreased knowledge of precautions, Impaired vision/preception, Decreased balance, Decreased knowledge of use of DME, Decreased cognition, Decreased mobility, Decreased strength, Postural dysfunction  Visit Diagnosis: Repeated falls  Unsteadiness on feet  Muscle weakness (generalized)  Difficulty in walking, not elsewhere classified     Problem List Patient Active Problem List   Diagnosis Date Noted   Chronic heart failure with preserved ejection fraction ([place]) 03/25/2021   Prostate cancer ([place]) 02/06/2021   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[place]) 07/26/2020   Bilateral hearing loss 03/20/2020   Chronic respiratory failure with hypoxia ([place]) 07/20/2019   DOE (dyspnea on exertion) 09/03/2017   Prediabetes 09/03/2017   Personal history of colonic polyps 07/17/2017   Tobacco abuse, in remission 07/01/2017   GERD (gastroesophageal reflux disease) 06/26/2017   Chest pain 06/25/2017   Dizziness 01/07/2017   Pure hypercholesterolemia 08/27/2016   BRBPR (bright red blood per rectum) 08/27/2016   Age-related osteoporosis without current pathological fracture 08/07/2016   Mixed hyperlipidemia 02/01/2016   Leg pain, left 01/12/2016   Coronary artery disease involving coronary bypass graft of native heart with angina pectoris ([place]) 06/13/2015   Alzheimer's disease ([place]) 04/26/2015   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 artery ([place]) 04/26/2015   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 artery ([place])  04/26/2015   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   Palpitations 04/03/2015   S/P CABG (coronary artery bypass graft) 04/03/2015   Blind left eye 12/27/2014   Encounter for general adult medical examination without abnormal findings 12/27/2014   Small vessel disease, cerebrovascular 11/02/2014   Arteriovenous malformation of colon 06/25/2014   Benign localized hyperplasia of prostate with urinary obstruction 06/25/2014   Chronic kidney disease (CKD), stage III (moderate) ([place]) 06/25/2014   COPD (chronic obstructive pulmonary disease) ([place]) 06/25/2014   Essential hypertension 06/25/2014   Abdominal aortic aneurysm, without rupture, unspecified ([place]) 06/25/2014   Benign neoplasm of colon 06/25/2014   Carotid artery stenosis 06/25/2014   Centrilobular emphysema ([place]) 06/25/2014   Diverticulosis of intestine, part unspecified, without perforation or abscess without bleeding 06/25/2014   Hearing loss 06/25/2014   Ischemic colitis ([place]) 06/25/2014   Mixed conductive and sensorineural hearing loss of left ear with restricted hearing of right ear 06/25/2014   Nocturia 06/25/2014   Occlusion and stenosis of multiple and bilateral precerebral arteries 06/25/2014   Organic impotence 06/25/2014   Postmastoidectomy complication 09/47/0962   Testicular hypofunction 06/25/2014   Unilateral inguinal hernia, without obstruction or gangrene, not specified as recurrent 06/25/2014   Urinary urgency 06/25/2014   Visual impairment 06/25/2014   Occlusion and stenosis of carotid artery without mention of cerebral infarction 03/22/2014   Aneurysm of abdominal vessel ([place]) 03/17/2012   Ann Lions PT, DPT, PN2   Supplemental Physical Therapist [place]  [place]. [place], [place], [address] Phone: [phone]   Fax:  [phone]  Name: FRAZER RAINVILLE MRN: [id number] Date of Birth: [birth year]

## 2022-03-28 ENCOUNTER — Encounter: Payer: Self-pay | Admitting: Physical Therapy

## 2022-03-28 ENCOUNTER — Ambulatory Visit: Payer: Medicare PPO | Attending: Family Medicine | Admitting: Physical Therapy

## 2022-03-28 DIAGNOSIS — R262 Difficulty in walking, not elsewhere classified: Secondary | ICD-10-CM | POA: Diagnosis present

## 2022-03-28 DIAGNOSIS — M6281 Muscle weakness (generalized): Secondary | ICD-10-CM | POA: Diagnosis present

## 2022-03-28 DIAGNOSIS — R296 Repeated falls: Secondary | ICD-10-CM | POA: Diagnosis not present

## 2022-03-28 DIAGNOSIS — R2681 Unsteadiness on feet: Secondary | ICD-10-CM | POA: Insufficient documentation

## 2022-03-28 NOTE — Therapy (Signed)
[place]. [place], [place], [address] Phone: [phone]   Fax:  [phone]  Physical Therapy Treatment  Patient Details  Name: Juan Montoya MRN: [id number] Date of Birth: 03/28/1941 Referring Provider (PT): Bernerd Limbo   Encounter Date: 03/28/2022   PT End of Session - 03/28/22 1401     Visit Number 12    Number of Visits 17    Date for PT Re-Evaluation 04/10/22    Authorization Type [employer]    Authorization Time Period 02/13/22 to 04/10/22    Progress Note Due on Visit 60    PT Start Time 1318    PT Stop Time 1357    PT Time Calculation (min) 39 min    Activity Tolerance Patient tolerated treatment well    Behavior During Therapy [facility] for tasks assessed/performed             Past Medical History:  Diagnosis Date   AAA (abdominal aortic aneurysm) ([place])    Abdominal aortic aneurysm (AAA) 3.0 cm to 5.5 cm in diameter in male [facility])    Cancer ([place])    prostate cancer grade 7    Carotid artery occlusion    Chronic kidney disease    Coronary artery disease    Dementia ([place])    GERD (gastroesophageal reflux disease)    Hyperlipidemia    Hypertension    Osteoporosis    Peripheral vascular disease ([place])    S/P colonoscopy 01/12/12   Stroke [facility])     Past Surgical History:  Procedure Laterality Date   CARDIAC CATHETERIZATION  05/2015   CORONARY ARTERY BYPASS GRAFT  2009   4 Vessel   MASTOIDECTOMY     PROSTATE SURGERY      There were no vitals filed for this visit.   Subjective Assessment - 03/28/22 1320     Subjective No changes since last time per daughter, just staying steady. Have some concerns about word garbling    Patient is accompained by: Family member    Patient Stated Goals reduce falls, get stronger, get more active    Currently in Pain? No/denies                               [facility] Adult PT Treatment/Exercise - 03/28/22 0001       Knee/Hip Exercises:  Aerobic   Nustep L5x6 minutes   BLEs only     Knee/Hip Exercises: Standing   Gait Training gait outside: walking over hills, grass, mulch, and half of large hill outside; multiple standing rest breaks    Other Standing Knee Exercises dual tasking- forward walking catching ball at various angles    Other Standing Knee Exercises backwards walkig with min guard for balance; scavenger hunt looking for cones- needed total assist to find even 2/5 cones hidden in clinic                     PT Education - 03/28/22 1400     Education Details O2 for activity, f/u with neurologist regarding potentially progressing Alzheimer's sx    Person(s) Educated Patient    Methods Explanation    Comprehension Verbalized understanding              PT Short Term Goals - 03/13/22 1004       PT SHORT TERM GOAL #1   Title Will be compliant with appropriate progressive HEP with MinA  from spouse    Baseline "does what I ask him to do but doesn't like it"    Time 3    Period Weeks    Status Achieved      PT SHORT TERM GOAL #2   Title Will be able to complete functional sit to stand transfers with no UEs and no more than min guard assist on 8/10 attempts    Time 4    Period Weeks    Status Achieved      PT SHORT TERM GOAL #3   Title Will be able to complete bed mobility with no more than min guard assist    Baseline "I see some improvement, does not need any physical assist"    Time 4    Period Weeks    Status Achieved      PT SHORT TERM GOAL #4   Title Gait pattern to improve including improved step lengths and increased stance time, at least 50% reduction in shuffling gait pattern    Baseline Inconsistent secondary to cognition    Time 2    Period Weeks    Status On-going      PT SHORT TERM GOAL #5   Title Patient and spouse will be able to list at least 3 ways to reduce fall risk at home and in the community    Baseline able to name 2/3 ways    Time 4    Period Weeks    Status  Partially Met               PT Long Term Goals - 03/13/22 1008       PT LONG TERM GOAL #1   Title MMT to improve by at least 1 grade in all weak groups    Baseline hard to assess due to cognition    Time 8    Period Weeks    Status Deferred      PT LONG TERM GOAL #2   Title Berg score to improve to at least 45 in order to show reduced fall risk/improved functional balance    Time 8    Period Weeks    Status On-going      PT LONG TERM GOAL #3   Title Will be able to complete floor to stand transfers with no more than MinA from spouse and safe technique    Baseline CGA    Time 8    Status Achieved                   Plan - 03/28/22 1401     Clinical Impression Statement [name] arrives today doing OK, daughter has some concerns about word garbling that he has been doing more frequent recently- it started a couple of years ago but has gotten more frequent. SPO2 87% on room air upon entry, we did use 2LPM O2 during session to maintain sats with activity. Daughter really concerned about progression of Alzheimers and I did provide education on this during session while he was on the Nustep. Otherwise progressed multitasking incorporating balance and functional activity tolerance as able.    Personal Factors and Comorbidities Fitness;Behavior Pattern;Comorbidity 3+;Time since onset of injury/illness/exacerbation;Age    Examination-Activity Limitations Locomotion Level;Transfers;Bed Mobility;Squat;Stairs;Stand    Examination-Participation Restrictions Community Activity;Shop;Laundry;Volunteer;Yard Work    Stability/Clinical Decision Making Stable/Uncomplicated    Clinical Decision Making Low    Rehab Potential Good    PT Frequency 2x / week    PT Duration 4 weeks  PT Treatment/Interventions ADLs/Self Care Home Management;Gait training;Therapeutic activities;Therapeutic exercise;Manual techniques;Neuromuscular re-education    PT Next Visit Plan needs to wear O2 in PT as he  consistently desaturates with activity on RA. focus on balance, strength, functional activity tolerance; Be mindful of O2, need to check with exercise per MD needs to be at least 88%  (uses O2 PRN)    PT Home Exercise Plan [id number]    Consulted and Agree with Plan of Care Patient;Family member/caregiver    Family Member Consulted daughter             Patient will benefit from skilled therapeutic intervention in order to improve the following deficits and impairments:  Abnormal gait, Decreased coordination, Difficulty walking, Decreased endurance, Decreased safety awareness, Decreased activity tolerance, Decreased knowledge of precautions, Impaired vision/preception, Decreased balance, Decreased knowledge of use of DME, Decreased cognition, Decreased mobility, Decreased strength, Postural dysfunction  Visit Diagnosis: Repeated falls  Unsteadiness on feet  Difficulty in walking, not elsewhere classified  Muscle weakness (generalized)     Problem List Patient Active Problem List   Diagnosis Date Noted   Chronic heart failure with preserved ejection fraction ([place]) 03/25/2021   Prostate cancer ([place]) 02/06/2021   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[place]) 07/26/2020   Bilateral hearing loss 03/20/2020   Chronic respiratory failure with hypoxia ([place]) 07/20/2019   DOE (dyspnea on exertion) 09/03/2017   Prediabetes 09/03/2017   Personal history of colonic polyps 07/17/2017   Tobacco abuse, in remission 07/01/2017   GERD (gastroesophageal reflux disease) 06/26/2017   Chest pain 06/25/2017   Dizziness 01/07/2017   Pure hypercholesterolemia 08/27/2016   BRBPR (bright red blood per rectum) 08/27/2016   Age-related osteoporosis without current pathological fracture 08/07/2016   Mixed hyperlipidemia 02/01/2016   Leg pain, left 01/12/2016   Coronary artery disease involving coronary bypass graft of native heart with angina pectoris ([place]) 06/13/2015   Alzheimer's disease ([place]) 04/26/2015    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 artery ([place]) 04/26/2015   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 artery ([place]) 04/26/2015   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   Palpitations 04/03/2015   S/P CABG (coronary artery bypass graft) 04/03/2015   Blind left eye 12/27/2014   Encounter for general adult medical examination without abnormal findings 12/27/2014   Small vessel disease, cerebrovascular 11/02/2014   Arteriovenous malformation of colon 06/25/2014   Benign localized hyperplasia of prostate with urinary obstruction 06/25/2014   Chronic kidney disease (CKD), stage III (moderate) ([place]) 06/25/2014   COPD (chronic obstructive pulmonary disease) ([place]) 06/25/2014   Essential hypertension 06/25/2014   Abdominal aortic aneurysm, without rupture, unspecified ([place]) 06/25/2014   Benign neoplasm of colon 06/25/2014   Carotid artery stenosis 06/25/2014   Centrilobular emphysema ([place]) 06/25/2014   Diverticulosis of intestine, part unspecified, without perforation or abscess without bleeding 06/25/2014   Hearing loss 06/25/2014   Ischemic colitis ([place]) 06/25/2014   Mixed conductive and sensorineural hearing loss of left ear with restricted hearing of right ear 06/25/2014   Nocturia 06/25/2014   Occlusion and stenosis of multiple and bilateral precerebral arteries 06/25/2014   Organic impotence 06/25/2014   Postmastoidectomy complication 37/34/2876   Testicular hypofunction 06/25/2014   Unilateral inguinal hernia, without obstruction or gangrene, not specified as recurrent 06/25/2014   Urinary urgency 06/25/2014   Visual impairment 06/25/2014   Occlusion and stenosis of carotid artery without mention of cerebral infarction 03/22/2014   Aneurysm of abdominal vessel ([place]) 03/17/2012   Ann Lions PT, DPT,  PN2   Supplemental Physical Therapist [place]. [place], [place], [address] Phone: [phone]   Fax:  [phone]  Name: HANSEL DEVAN MRN: [id number] Date of Birth: 1941/10/17

## 2022-04-01 ENCOUNTER — Ambulatory Visit: Payer: Medicare PPO | Admitting: Physical Therapy

## 2022-04-01 ENCOUNTER — Encounter: Payer: Self-pay | Admitting: Physical Therapy

## 2022-04-01 DIAGNOSIS — R296 Repeated falls: Secondary | ICD-10-CM | POA: Diagnosis not present

## 2022-04-01 DIAGNOSIS — M6281 Muscle weakness (generalized): Secondary | ICD-10-CM

## 2022-04-01 DIAGNOSIS — R262 Difficulty in walking, not elsewhere classified: Secondary | ICD-10-CM

## 2022-04-01 DIAGNOSIS — R2681 Unsteadiness on feet: Secondary | ICD-10-CM

## 2022-04-01 NOTE — Therapy (Signed)
[place]. [place], [place], [address] Phone: [phone]   Fax:  [phone]  Physical Therapy Treatment  Patient Details  Name: Juan Montoya MRN: [id number] Date of Birth: Mar 07, 1941 Referring Provider (PT): Bernerd Limbo   Encounter Date: 04/01/2022   PT End of Session - 04/01/22 0948     Visit Number 13    Number of Visits 17    Date for PT Re-Evaluation 04/10/22    Authorization Type [employer]    PT Start Time 0928    PT Stop Time 1011    PT Time Calculation (min) 43 min    Activity Tolerance Patient tolerated treatment well    Behavior During Therapy [facility] for tasks assessed/performed             Past Medical History:  Diagnosis Date   AAA (abdominal aortic aneurysm) ([place])    Abdominal aortic aneurysm (AAA) 3.0 cm to 5.5 cm in diameter in male [facility])    Cancer ([place])    prostate cancer grade 7    Carotid artery occlusion    Chronic kidney disease    Coronary artery disease    Dementia ([place])    GERD (gastroesophageal reflux disease)    Hyperlipidemia    Hypertension    Osteoporosis    Peripheral vascular disease ([place])    S/P colonoscopy 01/12/12   Stroke [facility])     Past Surgical History:  Procedure Laterality Date   CARDIAC CATHETERIZATION  05/2015   CORONARY ARTERY BYPASS GRAFT  2009   4 Vessel   MASTOIDECTOMY     PROSTATE SURGERY      There were no vitals filed for this visit.   Subjective Assessment - 04/01/22 0949     Subjective Wife reports that they went shopping at [employer] and did well, some pain in the low back with walking    Currently in Pain? No/denies                               [facility] Adult PT Treatment/Exercise - 04/01/22 0001       Transfers   Comments down onto mat on hands and knees and then back up      Ambulation/Gait   Gait Comments gait outside around the back building on rest break sitting of 1 minute and one rest break standing 30  seconds, O2 sats never below 89%      High Level Balance   High Level Balance Comments walking in [place] on the airex balance beam, on aorex ball toss and catch, on airex cone toe touches, then touches with his hands      Knee/Hip Exercises: Seated   Sit to Sand 2 sets;5 reps;without UE support                       PT Short Term Goals - 03/13/22 1004       PT SHORT TERM GOAL #1   Title Will be compliant with appropriate progressive HEP with MinA from spouse    Baseline "does what I ask him to do but doesn't like it"    Time 3    Period Weeks    Status Achieved      PT SHORT TERM GOAL #2   Title Will be able to complete functional sit to stand transfers with no UEs and no more than min guard assist on 8/10  attempts    Time 4    Period Weeks    Status Achieved      PT SHORT TERM GOAL #3   Title Will be able to complete bed mobility with no more than min guard assist    Baseline "I see some improvement, does not need any physical assist"    Time 4    Period Weeks    Status Achieved      PT SHORT TERM GOAL #4   Title Gait pattern to improve including improved step lengths and increased stance time, at least 50% reduction in shuffling gait pattern    Baseline Inconsistent secondary to cognition    Time 2    Period Weeks    Status On-going      PT SHORT TERM GOAL #5   Title Patient and spouse will be able to list at least 3 ways to reduce fall risk at home and in the community    Baseline able to name 2/3 ways    Time 4    Period Weeks    Status Partially Met               PT Long Term Goals - 04/01/22 1019       PT LONG TERM GOAL #2   Title Berg score to improve to at least 45 in order to show reduced fall risk/improved functional balance    Status Partially Met      PT LONG TERM GOAL #3   Title Will be able to complete floor to stand transfers with no more than MinA from spouse and safe technique    Status Achieved      PT LONG TERM GOAL #4    Title Will be active in gym/group based exercise activities such as water aerobics or silver sneakers to maintain functional gains and prevent recurrence of symptoms    Status On-going                   Plan - 04/01/22 1020     Clinical Impression Statement Patient did well with all activities today, O2 saturation never dropped elow 89% and mostly was above 92%.  He did c/o some buttock pain with the walking, he got up from sitting from 17" height wihtout hands 2x5 and did not struggle.  Worked on getting up from floor and this was diffiuclt but could do it from hands and knees.    PT Next Visit Plan continue to push his abilities with function and balance    Consulted and Agree with Plan of Care Patient;Family member/caregiver    Family Member Consulted wife             Patient will benefit from skilled therapeutic intervention in order to improve the following deficits and impairments:  Abnormal gait, Decreased coordination, Difficulty walking, Decreased endurance, Decreased safety awareness, Decreased activity tolerance, Decreased knowledge of precautions, Impaired vision/preception, Decreased balance, Decreased knowledge of use of DME, Decreased cognition, Decreased mobility, Decreased strength, Postural dysfunction  Visit Diagnosis: Repeated falls  Unsteadiness on feet  Difficulty in walking, not elsewhere classified  Muscle weakness (generalized)     Problem List Patient Active Problem List   Diagnosis Date Noted   Chronic heart failure with preserved ejection fraction ([place]) 03/25/2021   Prostate cancer ([place]) 02/06/2021   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[place]) 07/26/2020   Bilateral hearing loss 03/20/2020   Chronic respiratory failure with hypoxia ([place]) 07/20/2019   DOE (dyspnea on exertion) 09/03/2017  Prediabetes 09/03/2017   Personal history of colonic polyps 07/17/2017   Tobacco abuse, in remission 07/01/2017   GERD (gastroesophageal reflux disease)  06/26/2017   Chest pain 06/25/2017   Dizziness 01/07/2017   Pure hypercholesterolemia 08/27/2016   BRBPR (bright red blood per rectum) 08/27/2016   Age-related osteoporosis without current pathological fracture 08/07/2016   Mixed hyperlipidemia 02/01/2016   Leg pain, left 01/12/2016   Coronary artery disease involving coronary bypass graft of native heart with angina pectoris ([place]) 06/13/2015   Alzheimer's disease ([place]) 04/26/2015   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 artery ([place]) 04/26/2015   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 artery ([place]) 04/26/2015   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   Palpitations 04/03/2015   S/P CABG (coronary artery bypass graft) 04/03/2015   Blind left eye 12/27/2014   Encounter for general adult medical examination without abnormal findings 12/27/2014   Small vessel disease, cerebrovascular 11/02/2014   Arteriovenous malformation of colon 06/25/2014   Benign localized hyperplasia of prostate with urinary obstruction 06/25/2014   Chronic kidney disease (CKD), stage III (moderate) ([place]) 06/25/2014   COPD (chronic obstructive pulmonary disease) ([place]) 06/25/2014   Essential hypertension 06/25/2014   Abdominal aortic aneurysm, without rupture, unspecified ([place]) 06/25/2014   Benign neoplasm of colon 06/25/2014   Carotid artery stenosis 06/25/2014   Centrilobular emphysema ([place]) 06/25/2014   Diverticulosis of intestine, part unspecified, without perforation or abscess without bleeding 06/25/2014   Hearing loss 06/25/2014   Ischemic colitis ([place]) 06/25/2014   Mixed conductive and sensorineural hearing loss of left ear with restricted hearing of right ear 06/25/2014   Nocturia 06/25/2014   Occlusion and stenosis of multiple and bilateral precerebral arteries 06/25/2014   Organic impotence 06/25/2014   Postmastoidectomy complication 39/43/2003   Testicular hypofunction 06/25/2014    Unilateral inguinal hernia, without obstruction or gangrene, not specified as recurrent 06/25/2014   Urinary urgency 06/25/2014   Visual impairment 06/25/2014   Occlusion and stenosis of carotid artery without mention of cerebral infarction 03/22/2014   Aneurysm of abdominal vessel ([place]) 03/17/2012    Sumner Boast, PT 04/01/2022, 10:56 AM  [place]. [place], [place], [address] Phone: [phone]   Fax:  [phone]  Name: Juan Montoya MRN: [id number] Date of Birth: 12/23/1940

## 2022-04-03 ENCOUNTER — Ambulatory Visit: Payer: Medicare PPO | Admitting: Physical Therapy

## 2022-04-03 ENCOUNTER — Encounter: Payer: Self-pay | Admitting: Physical Therapy

## 2022-04-03 DIAGNOSIS — M6281 Muscle weakness (generalized): Secondary | ICD-10-CM

## 2022-04-03 DIAGNOSIS — R296 Repeated falls: Secondary | ICD-10-CM | POA: Diagnosis not present

## 2022-04-03 DIAGNOSIS — R262 Difficulty in walking, not elsewhere classified: Secondary | ICD-10-CM

## 2022-04-03 DIAGNOSIS — R2681 Unsteadiness on feet: Secondary | ICD-10-CM

## 2022-04-03 NOTE — Therapy (Signed)
[place]. [place], [place], [address] Phone: [phone]   Fax:  [phone]  Physical Therapy Treatment  Patient Details  Name: Juan Montoya MRN: [id number] Date of Birth: 01-Mar-1941 Referring Provider (PT): Bernerd Limbo   Encounter Date: 04/03/2022   PT End of Session - 04/03/22 1017     Visit Number 14    Date for PT Re-Evaluation 04/10/22    Authorization Time Period 02/13/22 to 04/10/22    PT Start Time 0930    PT Stop Time 0017    PT Time Calculation (min) 45 min    Activity Tolerance Patient tolerated treatment well    Behavior During Therapy [employer] for tasks assessed/performed             Past Medical History:  Diagnosis Date   AAA (abdominal aortic aneurysm) ([place])    Abdominal aortic aneurysm (AAA) 3.0 cm to 5.5 cm in diameter in male [facility])    Cancer ([place])    prostate cancer grade 7    Carotid artery occlusion    Chronic kidney disease    Coronary artery disease    Dementia ([place])    GERD (gastroesophageal reflux disease)    Hyperlipidemia    Hypertension    Osteoporosis    Peripheral vascular disease ([place])    S/P colonoscopy 01/12/12   St[facility]     Past Surgical History:  Procedure Laterality Date   CARDIAC CATHETERIZATION  05/2015   CORONARY ARTERY BYPASS GRAFT  2009   4 Vessel   MASTOIDECTOMY     PROSTATE SURGERY      There were no vitals filed for this visit.   Subjective Assessment - 04/03/22 0933     Subjective Wife reports that things are about the same.    Currently in Pain? No/denies                               [facility] Adult PT Treatment/Exercise - 04/03/22 0001       Ambulation/Gait   Ambulation/Gait Yes    Ambulation/Gait Assistance 5: Supervision    Ambulation Distance (Feet) 225 Feet    Assistive device None    Gait Pattern Step-through pattern;Decreased arm swing - right;Decreased arm swing - left;Decreased step length -  left;Decreased step length - right;Shuffle;Decreased hip/knee flexion - right;Decreased hip/knee flexion - left    Stairs Yes    Stairs Assistance 5: Supervision    Stair Management Technique One rail Right;Two rails;No rails;Alternating pattern;Step to pattern;Forwards    Number of Stairs 9    Height of Stairs 4      High Level Balance   High Level Balance Comments In parallel bars, side steps on airex, marching on airex, forward and side steps over objects.      Knee/Hip Exercises: Aerobic   Nustep L4x6 minutes                       PT Short Term Goals - 03/13/22 1004       PT SHORT TERM GOAL #1   Title Will be compliant with appropriate progressive HEP with MinA from spouse    Baseline "does what I ask him to do but doesn't like it"    Time 3    Period Weeks    Status Achieved      PT SHORT TERM GOAL #2   Title Will be able  to complete functional sit to stand transfers with no UEs and no more than min guard assist on 8/10 attempts    Time 4    Period Weeks    Status Achieved      PT SHORT TERM GOAL #3   Title Will be able to complete bed mobility with no more than min guard assist    Baseline "I see some improvement, does not need any physical assist"    Time 4    Period Weeks    Status Achieved      PT SHORT TERM GOAL #4   Title Gait pattern to improve including improved step lengths and increased stance time, at least 50% reduction in shuffling gait pattern    Baseline Inconsistent secondary to cognition    Time 2    Period Weeks    Status On-going      PT SHORT TERM GOAL #5   Title Patient and spouse will be able to list at least 3 ways to reduce fall risk at home and in the community    Baseline able to name 2/3 ways    Time 4    Period Weeks    Status Partially Met               PT Long Term Goals - 04/01/22 1019       PT LONG TERM GOAL #2   Title Berg score to improve to at least 45 in order to show reduced fall risk/improved  functional balance    Status Partially Met      PT LONG TERM GOAL #3   Title Will be able to complete floor to stand transfers with no more than MinA from spouse and safe technique    Status Achieved      PT LONG TERM GOAL #4   Title Will be active in gym/group based exercise activities such as water aerobics or silver sneakers to maintain functional gains and prevent recurrence of symptoms    Status On-going                   Plan - 04/03/22 1018     Clinical Impression Statement Pt enters feeling well. O2 did drop to 86% without supplemental O2 after resisted gait. Supplemental O2 applies and saturations stayed above 92%. Some instability present with alt box taps. Pt did well overall with interventions in parallel bars.    Personal Factors and Comorbidities Fitness;Behavior Pattern;Comorbidity 3+;Time since onset of injury/illness/exacerbation;Age    Examination-Activity Limitations Locomotion Level;Transfers;Bed Mobility;Squat;Stairs;Stand    Examination-Participation Restrictions Community Activity;Shop;Laundry;Volunteer;Yard Work    Stability/Clinical Decision Making Stable/Uncomplicated    Rehab Potential Good    PT Frequency 2x / week    PT Duration 4 weeks    PT Treatment/Interventions ADLs/Self Care Home Management;Gait training;Therapeutic activities;Therapeutic exercise;Manual techniques;Neuromuscular re-education    PT Next Visit Plan continue to push his abilities with function and balance             Patient will benefit from skilled therapeutic intervention in order to improve the following deficits and impairments:  Abnormal gait, Decreased coordination, Difficulty walking, Decreased endurance, Decreased safety awareness, Decreased activity tolerance, Decreased knowledge of precautions, Impaired vision/preception, Decreased balance, Decreased knowledge of use of DME, Decreased cognition, Decreased mobility, Decreased strength, Postural dysfunction  Visit  Diagnosis: Repeated falls  Unsteadiness on feet  Difficulty in walking, not elsewhere classified  Muscle weakness (generalized)     Problem List Patient Active Problem List   Diagnosis  Date Noted   Chronic heart failure with preserved ejection fraction ([place]) 03/25/2021   Prostate cancer ([place]) 02/06/2021   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[place]) 07/26/2020   Bilateral hearing loss 03/20/2020   Chronic respiratory failure with hypoxia ([place]) 07/20/2019   DOE (dyspnea on exertion) 09/03/2017   Prediabetes 09/03/2017   Personal history of colonic polyps 07/17/2017   Tobacco abuse, in remission 07/01/2017   GERD (gastroesophageal reflux disease) 06/26/2017   Chest pain 06/25/2017   Dizziness 01/07/2017   Pure hypercholesterolemia 08/27/2016   BRBPR (bright red blood per rectum) 08/27/2016   Age-related osteoporosis without current pathological fracture 08/07/2016   Mixed hyperlipidemia 02/01/2016   Leg pain, left 01/12/2016   Coronary artery disease involving coronary bypass graft of native heart with angina pectoris ([place]) 06/13/2015   Alzheimer's disease ([place]) 04/26/2015   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 artery ([place]) 04/26/2015   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 artery ([place]) 04/26/2015   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   Palpitations 04/03/2015   S/P CABG (coronary artery bypass graft) 04/03/2015   Blind left eye 12/27/2014   Encounter for general adult medical examination without abnormal findings 12/27/2014   Small vessel disease, cerebrovascular 11/02/2014   Arteriovenous malformation of colon 06/25/2014   Benign localized hyperplasia of prostate with urinary obstruction 06/25/2014   Chronic kidney disease (CKD), stage III (moderate) ([place]) 06/25/2014   COPD (chronic obstructive pulmonary disease) ([place]) 06/25/2014   Essential hypertension 06/25/2014   Abdominal aortic aneurysm, without  rupture, unspecified ([place]) 06/25/2014   Benign neoplasm of colon 06/25/2014   Carotid artery stenosis 06/25/2014   Centrilobular emphysema ([place]) 06/25/2014   Diverticulosis of intestine, part unspecified, without perforation or abscess without bleeding 06/25/2014   Hearing loss 06/25/2014   Ischemic colitis ([place]) 06/25/2014   Mixed conductive and sensorineural hearing loss of left ear with restricted hearing of right ear 06/25/2014   Nocturia 06/25/2014   Occlusion and stenosis of multiple and bilateral precerebral arteries 06/25/2014   Organic impotence 06/25/2014   Postmastoidectomy complication 17/79/3903   Testicular hypofunction 06/25/2014   Unilateral inguinal hernia, without obstruction or gangrene, not specified as recurrent 06/25/2014   Urinary urgency 06/25/2014   Visual impairment 06/25/2014   Occlusion and stenosis of carotid artery without mention of cerebral infarction 03/22/2014   Aneurysm of abdominal vessel ([place]) 03/17/2012    Scot Jun, PTA 04/03/2022, 10:44 AM  [place]. [place], [place], [address] Phone: [phone]   Fax:  [phone]  Name: Juan Montoya MRN: [id number] Date of Birth: 1941-08-12

## 2022-04-08 ENCOUNTER — Encounter: Payer: Self-pay | Admitting: Physical Therapy

## 2022-04-08 ENCOUNTER — Ambulatory Visit: Payer: Medicare PPO | Admitting: Physical Therapy

## 2022-04-08 DIAGNOSIS — M6281 Muscle weakness (generalized): Secondary | ICD-10-CM

## 2022-04-08 DIAGNOSIS — R296 Repeated falls: Secondary | ICD-10-CM

## 2022-04-08 DIAGNOSIS — R262 Difficulty in walking, not elsewhere classified: Secondary | ICD-10-CM

## 2022-04-08 DIAGNOSIS — R2681 Unsteadiness on feet: Secondary | ICD-10-CM

## 2022-04-08 NOTE — Therapy (Signed)
[place] [place], [place], [address] Phone: [phone]   Fax:  [phone]  Physical Therapy Treatment  Patient Details  Name: Juan Montoya MRN: [id number] Date of Birth: 01-20-1941 Referring Provider (PT): Bernerd Limbo   Encounter Date: 04/08/2022  PHYSICAL THERAPY DISCHARGE SUMMARY  Visits from Start of Care: 15  Current functional level related to goals / functional outcomes: Has maximized benefit from skilled PT services thank you for the referral!    Remaining deficits: See below    Education / Equipment: See below    Patient agrees to discharge. Patient goals were partially met. Patient is being discharged due to maximized rehab potential.     PT End of Session - 04/08/22 1006     Visit Number 15    Number of Visits 15    Authorization Type [employer]    Authorization Time Period 02/13/22 to 04/10/22    Progress Note Due on Visit 25    PT Start Time 0932    PT Stop Time 1000   DC today   PT Time Calculation (min) 28 min    Activity Tolerance Patient tolerated treatment well    Behavior During Therapy WFL for tasks assessed/performed             Past Medical History:  Diagnosis Date   AAA (abdominal aortic aneurysm) ([place])    Abdominal aortic aneurysm (AAA) 3.0 cm to 5.5 cm in diameter in male [facility])    Cancer (HCC)    prostate cancer grade 7    Carotid artery occlusion    Chronic kidney disease    Coronary artery disease    Dementia (HCC)    GERD (gastroesophageal reflux disease)    Hyperlipidemia    Hypertension    Osteoporosis    Peripheral vascular disease ([place])    S/P colonoscopy 01/12/12   Stroke [facility])     Past Surgical History:  Procedure Laterality Date   CARDIAC CATHETERIZATION  05/2015   CORONARY ARTERY BYPASS GRAFT  2009   4 Vessel   MASTOIDECTOMY     PROSTATE SURGERY      There were no vitals filed for this visit.   Subjective Assessment - 04/08/22 0933      Subjective We're ok- not any worse but not really seeing anything better. I feel like [name] is at his optimum with all things considered. I don't want to be a defeatist but for example we went to the [facility] on Friday for a graduation, handicap spaces are 50 yards from the entrance and he was so fatigued just walking that he was wiped out and ready to fall    Patient is accompained by: Family member    Patient Stated Goals reduce falls, get stronger, get more active    Currently in Pain? No/denies                [facility] PT Assessment - 04/08/22 0001       Assessment   Medical Diagnosis falls    Referring Provider (PT) Bernerd Limbo    Onset Date/Surgical Date --   chronic   Next MD Visit Dr. Coletta Memos in 6 months or PRN    Prior Therapy none      Precautions   Precautions Fall;Other (comment)    Precaution Comments HOH, blind L eye, needs O2 intermittently      Restrictions   Weight Bearing Restrictions No      Balance Screen  Has the patient fallen in the past 6 months Yes    How many times? 4    Has the patient had a decrease in activity level because of a fear of falling?  Yes    Is the patient reluctant to leave their home because of a fear of falling?  No      Home Ecologist residence      Prior Function   Level of Independence Independent;Independent with basic ADLs;Independent with gait;Independent with transfers    Vocation Retired    Leisure going to [employer], going for rides on weekends      Strength   Right Hip Flexion 3+/5    Right Hip Extension 3-/5    Right Hip ABduction 4-/5    Left Hip Flexion 3/5    Left Hip Extension 3-/5    Left Hip ABduction 4-/5    Right Knee Flexion 3+/5    Right Knee Extension 4+/5    Left Knee Flexion 3/5    Left Knee Extension 5/5      Berg Balance Test   Sit to Stand Able to stand  independently using hands    Standing Unsupported Able to stand safely 2 minutes     Sitting with Back Unsupported but Feet Supported on Floor or Stool Able to sit safely and securely 2 minutes    Stand to Sit Controls descent by using hands    Transfers Able to transfer safely, definite need of hands    Standing Unsupported with Eyes Closed Able to stand 10 seconds with supervision    Standing Unsupported with Feet Together Able to place feet together independently and stand for 1 minute with supervision    From Standing, Reach Forward with Outstretched Arm Can reach forward >5 cm safely (2")    From Standing Position, Pick up Object from Floor Able to pick up shoe, needs supervision    From Standing Position, Turn to Look Behind Over each Shoulder Turn sideways only but maintains balance    Turn 360 Degrees Able to turn 360 degrees safely one side only in 4 seconds or less    Standing Unsupported, Alternately Place Feet on Step/Stool Able to complete 4 steps without aid or supervision    Standing Unsupported, One Foot in [employer] balance while stepping or standing    Standing on One Leg Unable to try or needs assist to prevent fall    Total Score 35    Berg comment: likely impacted by cognition and command following                                    PT Education - 04/08/22 1006     Education Details Education provided on community programs that might be beneficial such as PACE program and fall prevention strategies/education/home and task modifications. Also discussed local caregiver support groups, family politely declines.    Person(s) Educated Patient;Spouse    Methods Explanation    Comprehension Verbalized understanding              PT Short Term Goals - 04/08/22 0947       PT SHORT TERM GOAL #1   Title Will be compliant with appropriate progressive HEP with MinA from spouse    Baseline 6/12- not compliant, "he's not really able as far as specific command following"    Time 3  Period Weeks    Status Achieved      PT SHORT  TERM GOAL #2   Title Will be able to complete functional sit to stand transfers with no UEs and no more than min guard assist on 8/10 attempts    Time 4    Period Weeks    Status Achieved      PT SHORT TERM GOAL #3   Title Will be able to complete bed mobility with no more than min guard assist    Time 4    Period Weeks    Status Achieved      PT SHORT TERM GOAL #4   Title Gait pattern to improve including improved step lengths and increased stance time, at least 50% reduction in shuffling gait pattern    Baseline Inconsistent secondary to cognition    Time 2    Period Weeks    Status Not Met      PT SHORT TERM GOAL #5   Title Patient and spouse will be able to list at least 3 ways to reduce fall risk at home and in the community    Baseline 6/12- 2/3    Time 4    Period Weeks    Status Partially Met               PT Long Term Goals - 04/08/22 0950       PT LONG TERM GOAL #1   Title MMT to improve by at least 1 grade in all weak groups    Baseline hard to assess due to cognition    Time 8    Period Weeks    Status Not Met      PT LONG TERM GOAL #2   Title Berg score to improve to at least 45 in order to show reduced fall risk/improved functional balance    Baseline 35/56    Time 8    Period Weeks    Status Not Met      PT LONG TERM GOAL #3   Title Will be able to complete floor to stand transfers with no more than MinA from spouse and safe technique    Baseline CGA    Time 8    Period Weeks    Status Achieved      PT LONG TERM GOAL #4   Title Will be active in gym/group based exercise activities such as water aerobics or silver sneakers to maintain functional gains and prevent recurrence of symptoms    Baseline discussed    Time 8    Period Weeks    Status Not Met                   Plan - 04/08/22 1006     Clinical Impression Statement [name] arrives today with family, doing OK but family reports "he is not any better". Objective measures  were not improved today, but definitely impacted by cognition. I think at this time he has really maximized benefit from skilled PT services.  Education provided on community programs that might be beneficial such as PACE program and fall prevention strategies/education/home and task modifications. Also discussed local caregiver support groups, family politely declines. DC today, thank you for the referral!    Personal Factors and Comorbidities Fitness;Behavior Pattern;Comorbidity 3+;Time since onset of injury/illness/exacerbation;Age    Examination-Activity Limitations Locomotion Level;Transfers;Bed Mobility;Squat;Stairs;Stand    Examination-Participation Restrictions Community Activity;Shop;Laundry;Volunteer;Yard Work    Stability/Clinical Decision Making Stable/Uncomplicated    Designer, jewellery Low  Rehab Potential Good    PT Frequency Other (comment)   DC   PT Duration Other (comment)   DC   PT Treatment/Interventions ADLs/Self Care Home Management;Gait training;Therapeutic activities;Therapeutic exercise;Manual techniques;Neuromuscular re-education    PT Next Visit [place] and Agree with Plan of Care Patient;Family member/caregiver    Family Member Consulted wife             Patient will benefit from skilled therapeutic intervention in order to improve the following deficits and impairments:  Abnormal gait, Decreased coordination, Difficulty walking, Decreased endurance, Decreased safety awareness, Decreased activity tolerance, Decreased knowledge of precautions, Impaired vision/preception, Decreased balance, Decreased knowledge of use of DME, Decreased cognition, Decreased mobility, Decreased strength, Postural dysfunction  Visit Diagnosis: Repeated falls  Unsteadiness on feet  Difficulty in walking, not elsewhere classified  Muscle weakness (generalized)     Problem List Patient Active Problem List   Diagnosis Date  Noted   Chronic heart failure with preserved ejection fraction ([place]) 03/25/2021   Prostate cancer ([place]) 02/06/2021   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[place]) 07/26/2020   Bilateral hearing loss 03/20/2020   Chronic respiratory failure with hypoxia ([place]) 07/20/2019   DOE (dyspnea on exertion) 09/03/2017   Prediabetes 09/03/2017   Personal history of colonic polyps 07/17/2017   Tobacco abuse, in remission 07/01/2017   GERD (gastroesophageal reflux disease) 06/26/2017   Chest pain 06/25/2017   Dizziness 01/07/2017   Pure hypercholesterolemia 08/27/2016   BRBPR (bright red blood per rectum) 08/27/2016   Age-related osteoporosis without current pathological fracture 08/07/2016   Mixed hyperlipidemia 02/01/2016   Leg pain, left 01/12/2016   Coronary artery disease involving coronary bypass graft of native heart with angina pectoris ([place]) 06/13/2015   Alzheimer's disease ([place]) 04/26/2015   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 artery ([place]) 04/26/2015   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 artery ([place]) 04/26/2015   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   Palpitations 04/03/2015   S/P CABG (coronary artery bypass graft) 04/03/2015   Blind left eye 12/27/2014   Encounter for general adult medical examination without abnormal findings 12/27/2014   Small vessel disease, cerebrovascular 11/02/2014   Arteriovenous malformation of colon 06/25/2014   Benign localized hyperplasia of prostate with urinary obstruction 06/25/2014   Chronic kidney disease (CKD), stage III (moderate) ([place]) 06/25/2014   COPD (chronic obstructive pulmonary disease) ([place]) 06/25/2014   Essential hypertension 06/25/2014   Abdominal aortic aneurysm, without rupture, unspecified ([place]) 06/25/2014   Benign neoplasm of colon 06/25/2014   Carotid artery stenosis 06/25/2014   Centrilobular emphysema ([place]) 06/25/2014   Diverticulosis of intestine, part unspecified,  without perforation or abscess without bleeding 06/25/2014   Hearing loss 06/25/2014   Ischemic colitis ([place]) 06/25/2014   Mixed conductive and sensorineural hearing loss of left ear with restricted hearing of right ear 06/25/2014   Nocturia 06/25/2014   Occlusion and stenosis of multiple and bilateral precerebral arteries 06/25/2014   Organic impotence 06/25/2014   Postmastoidectomy complication 50/06/3817   Testicular hypofunction 06/25/2014   Unilateral inguinal hernia, without obstruction or gangrene, not specified as recurrent 06/25/2014   Urinary urgency 06/25/2014   Visual impairment 06/25/2014   Occlusion and stenosis of carotid artery without mention of cerebral infarction 03/22/2014   Aneurysm of abdominal vessel ([place]) 03/17/2012   Ann Lions PT, DPT, PN2   Supplemental Physical Therapist [facility]  [place] [place], [place], [address] Phone: [phone]   Fax:  [phone]  Name: COLTIN CASHER MRN: [id number] Date of Birth: 12/21/1940

## 2022-04-10 ENCOUNTER — Ambulatory Visit: Payer: Medicare PPO | Admitting: Physical Therapy

## 2022-09-26 ENCOUNTER — Ambulatory Visit: Payer: Medicare PPO | Admitting: Physician Assistant

## 2022-09-26 ENCOUNTER — Ambulatory Visit (HOSPITAL_COMMUNITY)
Admission: RE | Admit: 2022-09-26 | Discharge: 2022-09-26 | Disposition: A | Discharge status: Home / Self Care | Payer: Medicare PPO | Source: Ambulatory Visit | Attending: Vascular Surgery | Admitting: Vascular Surgery

## 2022-09-26 VITALS — BP 156/72 | HR 76 | Temp 97.7°F | Resp 20 | Ht 66.0 in | Wt 149.0 lb

## 2022-09-26 DIAGNOSIS — I714 Abdominal aortic aneurysm, without rupture, unspecified: Secondary | ICD-10-CM

## 2022-09-26 DIAGNOSIS — I671 Cerebral aneurysm, nonruptured: Secondary | ICD-10-CM | POA: Diagnosis not present

## 2022-09-26 DIAGNOSIS — I7143 Infrarenal abdominal aortic aneurysm, without rupture: Secondary | ICD-10-CM | POA: Diagnosis not present

## 2022-09-26 NOTE — Progress Notes (Signed)
Established Abdominal Aortic Aneurysm   History of Present Illness   Juan Montoya is a 81 y.o. (December 27, 1940) male who presents for surveillance of known AAA.  At his last visit with [place] in February the aneurysm measured 4.64 at its greatest diameter.  He has been without symptoms.  At today's follow-up the patient denies any new onset abdominal or back pain.  He also denies claudication, rest pain or ischemic changes to the lower extremities.  He has not had any other changes to his health recently.  He is still unsteady on his feet with no recent falls.  He still takes his aspirin and statin.  Current Outpatient Medications  Medication Sig Dispense Refill   albuterol (VENTOLIN HFA) 108 (90 Base) MCG/ACT inhaler Inhale into the lungs.     allopurinol (ZYLOPRIM) 100 MG tablet Take 200 mg by mouth daily.     amLODipine (NORVASC) 10 MG tablet Take 1 tablet by mouth daily.     aspirin EC 81 MG tablet Take 81 mg by mouth every other day.     atorvastatin (LIPITOR) 80 MG tablet Take 80 mg by mouth daily.     carvedilol (COREG) 6.25 MG tablet Take by mouth.     cloNIDine (CATAPRES) 0.1 MG tablet Take by mouth.     denosumab (PROLIA) 60 MG/ML SOSY injection Inject into the skin.     donepezil (ARICEPT) 10 MG tablet Take 10 mg by mouth every morning.      furosemide (LASIX) 40 MG tablet Take by mouth.     memantine (NAMENDA XR) 28 MG CP24 24 hr capsule Take 1 capsule (28 mg total) by mouth daily. 30 capsule    Multiple Vitamin (QUINTABS) TABS Take 1 tablet by mouth daily.     omeprazole (PRILOSEC) 40 MG capsule Take 1 capsule (40 mg total) by mouth daily. 30 capsule 0   Probiotic Product (PROBIOTIC & ACIDOPHILUS EX ST PO) Take 1 tablet by mouth daily.      sodium bicarbonate 650 MG tablet Take 1 tablet (650 mg total) by mouth daily.     solifenacin (VESICARE) 10 MG tablet Take 10 mg by mouth at bedtime.      Solifenacin Succinate (VESICARE PO) Take by mouth. (Patient not taking: Reported  on 12/11/2021)     Tiotropium Bromide-Olodaterol (STIOLTO RESPIMAT) 2.5-2.5 MCG/ACT AERS Inhale 1 puff into the lungs daily.     Triptorelin Pamoate (TRELSTAR MIXJECT) 3.75 MG injection Inject into the muscle.     No current facility-administered medications for this visit.    REVIEW OF SYSTEMS (negative unless checked):   Cardiac:  '[]'$  Chest pain or chest pressure? '[]'$  Shortness of breath upon activity? '[]'$  Shortness of breath when lying flat? '[]'$  Irregular heart rhythm?  Vascular:  '[]'$  Pain in calf, thigh, or hip brought on by walking? '[]'$  Pain in feet at night that wakes you up from your sleep? '[]'$  Blood clot in your veins? '[]'$  Leg swelling?  Pulmonary:  '[]'$  Oxygen at home? '[]'$  Productive cough? '[]'$  Wheezing?  Neurologic:  '[]'$  Sudden weakness in arms or legs? '[]'$  Sudden numbness in arms or legs? '[]'$  Sudden onset of difficult speaking or slurred speech? '[]'$  Temporary loss of vision in one eye? '[]'$  Problems with dizziness?  Gastrointestinal:  '[]'$  Blood in stool? '[]'$  Vomited blood?  Genitourinary:  '[]'$  Burning when urinating? '[]'$  Blood in urine?  Psychiatric:  '[]'$  Major depression  Hematologic:  '[]'$  Bleeding problems? '[]'$  Problems with blood clotting?  Dermatologic:  '[]'$   Rashes or ulcers?  Constitutional:  '[]'$  Fever or chills?  Ear/Nose/Throat:  '[]'$  Change in hearing? '[]'$  Nose bleeds? '[]'$  Sore throat?  Musculoskeletal:  '[]'$  Back pain? '[]'$  Joint pain? '[]'$  Muscle pain?   Physical Examination   Vitals:   09/26/22 0836  BP: (!) 156/72  Pulse: 76  Resp: 20  Temp: 97.7 F (36.5 C)  TempSrc: Temporal  SpO2: 90%  Weight: 149 lb (67.6 kg)  Height: '5\' 6"'$  (1.676 m)   Body mass index is 24.05 kg/m.  General:  WDWN in NAD; vital signs documented above Gait: Not observed HENT: WNL, normocephalic Pulmonary: normal non-labored breathing, CTAB Cardiac: regular HR without carotid bruit Abdomen: soft, NT, no masses Skin: without rashes Vascular Exam/Pulses: Palpable DP pulses   Extremities: without ischemic changes or open wounds Musculoskeletal: no muscle wasting or atrophy  Neurologic: A&O X 3;  No focal weakness or paresthesias are detected Psychiatric:  The pt has Normal affect.   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 (10/26/1941) male who presents for surveillance of asymptomatic AAA  Based on this patient's [place] today, his AAA has grown to 5.32cm at its largest diameter At his last visit with [place] 6 months ago it measured 4.64cm. There is also a supposed new ultrasound finding of a saccular aneurysm proximal to the fusiform aneurysm that measures 4.22 at its largest diameter. 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  He has slightly reduced kidney function with a recent creatinine of 2.0, so he will be scheduled for a CT chest/abdomen/pelvis with low contrast.  He then will follow-up with Dr. Stanford Breed in 1 week to discuss CT results and surgical repair   Vicente Serene PA-C [facility]: [phone]  MD on call: Stanford Breed

## 2022-10-04 ENCOUNTER — Other Ambulatory Visit: Payer: Self-pay

## 2022-10-04 DIAGNOSIS — I714 Abdominal aortic aneurysm, without rupture, unspecified: Secondary | ICD-10-CM

## 2022-10-07 ENCOUNTER — Ambulatory Visit (HOSPITAL_COMMUNITY)
Admission: RE | Admit: 2022-10-07 | Discharge: 2022-10-07 | Disposition: A | Discharge status: Home / Self Care | Payer: Medicare PPO | Source: Ambulatory Visit | Attending: Vascular Surgery | Admitting: Vascular Surgery

## 2022-10-07 DIAGNOSIS — I714 Abdominal aortic aneurysm, without rupture, unspecified: Secondary | ICD-10-CM | POA: Diagnosis not present

## 2022-10-07 MED ORDER — IOHEXOL 350 MG/ML SOLN
75.0000 mL | Freq: Once | INTRAVENOUS | Status: AC | PRN
  Administered 2022-10-07: 75 mL via INTRAVENOUS

## 2022-10-07 NOTE — Progress Notes (Unsigned)
[facility] [place]  ASSESSMENT / PLAN: [name] is a 81 y.o. male with a {aorticaneurysms:24839} measuring ***mm.  The Joint Council of the [place] [employer] and Society for Vascular Surgery estimates annual rupture risk based on abdominal aneurysm diameter. The patient's estimated risk is {aneurysmrupture:24841}  The patient is *** a candidate for elective repair of the aneurysm to prevent rupture.  I explained the risks / benefits / alternatives to different approaches to aortic reconstruction.   I explained the specific benefits of open repair including improved durability, limited requirement for surveillance, less need for secondary intervention. I explained the specific risks from open repair including higher physiologic stress from aortic cross clamping, higher risk of perioperative complication (including stroke, MI, pneumonia, renal insufficiency and failure, etc.), higher risk of abdominal wall complications, risk of anastomotic pseudoaneurysm.  I explained the specific benefits of endovascular repair including limited physiologic stress and less recovery time, lower risk of serious perioperative complication, zero risk of abdominal wall complication.  I explained the specific risks from endovascular repair including need for lifetime surveillance, risk of large-bore arterial access, risk of requiring secondary intervention to maintain seal or patency. I explained that not all patients are candidates for endovascular repair based on their unique anatomy.  After detailed discussion the patient and I agree that the best option for the patient is ***.   Recommend the following to reduce the risk of major adverse cardiac / limb events.  Complete cessation from all tobacco products. Blood glucose control with goal A1c < 7%. Blood pressure control with goal blood pressure < 140/90 mmHg. Lipid reduction therapy with goal LDL-C <100  mg/dL. Aspirin '81mg'$  PO QD.  Atorvastatin 40-'80mg'$  PO QD (or other "high intensity" statin therapy). *** The addition of ezetimibe or PCSK9 inhibitors may benefit patients with difficult to control hypercholesterolemia.    CHIEF COMPLAINT: ***  HISTORY OF PRESENT ILLNESS: Juan Montoya is a 81 y.o. male ***  VASCULAR SURGICAL HISTORY: ***  VASCULAR RISK FACTORS: {FINDINGS; POSITIVE NEGATIVE:([phone]} history of stroke / transient ischemic attack. {FINDINGS; POSITIVE NEGATIVE:(262) [phone]} history of coronary artery disease. *** history of PCI. *** history of CABG.  {FINDINGS; POSITIVE NEGATIVE:(262) [phone]} history of diabetes mellitus. Last A1c ***. {FINDINGS; POSITIVE NEGATIVE:(262) 886-1751} history of smoking. *** actively smoking. {FINDINGS; POSITIVE NEGATIVE:(262) 886-1751} history of hypertension. *** drug regimen with *** control. {FINDINGS; POSITIVE NEGATIVE:(262) [phone]} history of chronic kidney disease.  Last GFR ***. CKD {stage:30421363}. {FINDINGS; POSITIVE NEGATIVE:(262) 886-1751} history of chronic obstructive pulmonary disease, treated with ***.  FUNCTIONAL STATUS: ECOG performance status: {findings; ecog performance status:31780} Ambulatory status: {TNHAmbulation:25868}  CAREY 1 AND 3 YEAR INDEX Male (2pts) 75-79 or 80-84 (2pts) >84 (3pts) Dependence in toileting (1pt) Partial or full dependence in dressing (1pt) History of malignant neoplasm (2pts) CHF (3pts) COPD (1pts) CKD (3pts)  0-3 pts 6% 1 year mortality ; 21% 3 year mortality 4-5 pts 12% 1 year mortality ; 36% 3 year mortality >5 pts 21% 1 year mortality; 54% 3 year mortality   Past Medical History:  Diagnosis Date   AAA (abdominal aortic aneurysm) (HCC)    Abdominal aortic aneurysm (AAA) 3.0 cm to 5.5 cm in diameter in male [facility])    Cancer (HCC)    prostate cancer grade 7    Carotid artery occlusion    Chronic kidney disease    Coronary artery disease    Dementia (HCC)    GERD (gastroesophageal reflux  disease)    Hyperlipidemia    Hypertension  Osteoporosis    Peripheral vascular disease ([place])    S/P colonoscopy 01/12/12   Stroke [facility])     Past Surgical History:  Procedure Laterality Date   CARDIAC CATHETERIZATION  05/2015   CORONARY ARTERY BYPASS GRAFT  2009   4 Vessel   MASTOIDECTOMY     PROSTATE SURGERY      Family History  Problem Relation Age of Onset   Coronary artery disease Mother     Social History   Socioeconomic History   Marital status: Married    Spouse name: Not on file   Number of children: Not on file   Years of education: Not on file   Highest education level: Not on file  Occupational History   Not on file  Tobacco Use   Smoking status: Former    Types: Cigarettes    Quit date: 06/27/2008    Years since quitting: 14.2   Smokeless tobacco: Never  Vaping Use   Vaping Use: Never used  Substance and Sexual Activity   Alcohol use: Not Currently    Alcohol/week: 7.0 standard drinks of alcohol    Types: 7 Cans of beer per week   Drug use: No   Sexual activity: Never  Other Topics Concern   Not on file  Social History Narrative   Not on file   Social Determinants of Health   Financial Resource Strain: Not on file  Food Insecurity: Not on file  Transportation Needs: Not on file  Physical Activity: Not on file  Stress: Not on file  Social Connections: Not on file  Intimate Partner Violence: Not on file    Allergies  Allergen Reactions   Codeine Itching and Other (See Comments)    nausea nausea nausea Other reaction(s): Itching nausea nausea nausea    Nitroglycerin Other (See Comments)    Migraine Headach Other reaction(s): Other Causes severe headaches Other reaction(s): Other Causes severe headaches Causes severe headaches Causes severe headaches    Ace Inhibitors Other (See Comments)    hyponatremia hyponatremia hyponatremia Other reaction(s): Other hyponatremia Other reaction(s): Other (See  Comments) hyponatremia Other reaction(s): Other hyponatremia hyponatremia hyponatremia    Hydrochlorothiazide Other (See Comments)    hyponatremia hyponatremia hyponatremia Other reaction(s): Other hyponatremia Other reaction(s): Other hyponatremia hyponatremia hyponatremia     Current Outpatient Medications  Medication Sig Dispense Refill   albuterol (VENTOLIN HFA) 108 (90 Base) MCG/ACT inhaler Inhale into the lungs.     allopurinol (ZYLOPRIM) 100 MG tablet Take 200 mg by mouth daily.     amLODipine (NORVASC) 10 MG tablet Take 1 tablet by mouth daily.     aspirin EC 81 MG tablet Take 81 mg by mouth every other day.     atorvastatin (LIPITOR) 80 MG tablet Take 80 mg by mouth daily.     carvedilol (COREG) 6.25 MG tablet Take by mouth.     cloNIDine (CATAPRES) 0.1 MG tablet Take by mouth.     denosumab (PROLIA) 60 MG/ML SOSY injection Inject into the skin.     donepezil (ARICEPT) 10 MG tablet Take 10 mg by mouth every morning.      furosemide (LASIX) 40 MG tablet Take by mouth.     memantine (NAMENDA XR) 28 MG CP24 24 hr capsule Take 1 capsule (28 mg total) by mouth daily. 30 capsule    Multiple Vitamin (QUINTABS) TABS Take 1 tablet by mouth daily.     omeprazole (PRILOSEC) 40 MG capsule Take 1 capsule (40 mg total) by mouth daily. [place]  capsule 0   Probiotic Product (PROBIOTIC & ACIDOPHILUS EX ST PO) Take 1 tablet by mouth daily.      sodium bicarbonate 650 MG tablet Take 1 tablet (650 mg total) by mouth daily.     solifenacin (VESICARE) 10 MG tablet Take 10 mg by mouth at bedtime.      Solifenacin Succinate (VESICARE PO) Take by mouth.     Tiotropium Bromide-Olodaterol (STIOLTO RESPIMAT) 2.5-2.5 MCG/ACT AERS Inhale 1 puff into the lungs daily.     Triptorelin Pamoate (TRELSTAR MIXJECT) 3.75 MG injection Inject into the muscle.     No current facility-administered medications for this visit.    PHYSICAL EXAM There were no vitals filed for this visit.  Constitutional: ***  appearing. *** distress. Appears *** nourished.  Neurologic: CN ***. *** focal findings. *** sensory loss. Psychiatric: *** Mood and affect symmetric and appropriate. Eyes: *** No icterus. No conjunctival pallor. Ears, nose, throat: *** mucous membranes moist. Midline trachea.  Cardiac: *** rate and rhythm.  Respiratory: *** unlabored. Abdominal: *** soft, non-tender, non-distended.  Peripheral vascular: *** Extremity: *** edema. *** cyanosis. *** pallor.  Skin: *** gangrene. *** ulceration.  Lymphatic: *** Stemmer's sign. *** palpable lymphadenopathy.    PERTINENT LABORATORY AND RADIOLOGIC DATA  Most recent CBC    Latest Ref Rng & Units 04/01/2020    4:17 PM 08/14/2018    7:33 PM 07/08/2017    8:05 PM  CBC  WBC 4.0 - 10.5 K/uL 11.8  9.3  10.0   Hemoglobin 13.0 - 17.0 g/dL 14.7  15.3  14.7   Hematocrit 39.0 - 52.0 % 45.1  47.6  44.8   Platelets 150 - 400 K/uL 351  322  313      Most recent CMP    Latest Ref Rng & Units 04/01/2020    4:17 PM 08/14/2018    7:33 PM 07/08/2017    8:05 PM  CMP  Glucose 70 - 99 mg/dL 230  148  138   BUN 8 - 23 mg/dL '28  25  20   '$ Creatinine 0.61 - 1.24 mg/dL 1.91  1.77  1.80   Sodium 135 - 145 mmol/L 134  141  140   Potassium 3.5 - 5.1 mmol/L 3.8  3.6  3.9   Chloride 98 - 111 mmol/L 99  104  104   CO2 22 - 32 mmol/L '25  27  26   '$ Calcium 8.9 - 10.3 mg/dL 9.2  9.4  8.7   Total Protein 6.5 - 8.1 g/dL 7.5   7.5   Total Bilirubin 0.3 - 1.2 mg/dL 0.7   0.7   Alkaline Phos 38 - 126 U/L 96   99   AST 15 - 41 U/L 25   36   ALT 0 - 44 U/L 18   30     Renal function CrCl cannot be calculated (Patient's most recent lab result is older than the maximum 21 days allowed.).  Hgb A1c MFr Bld (%)  Date Value  06/26/2017 6.4 (H)    LDL Cholesterol  Date Value Ref Range Status  06/26/2017 61 0 - 99 mg/dL Final    Comment:           Total Cholesterol/HDL:CHD Risk Coronary Heart Disease Risk Table                     Men   Women  1/2 Average Risk    3.4   3.3  Average Risk  5.0   4.4  2 X Average Risk   9.6   7.1  3 X Average Risk  23.4   11.0        Use the calculated Patient Ratio above and the CHD Risk Table to determine the patient's CHD Risk.        ATP III CLASSIFICATION (LDL):  <100     mg/dL   Optimal  100-129  mg/dL   Near or Above                    Optimal  130-159  mg/dL   Borderline  160-189  mg/dL   High  >190     mg/dL   Very High      Vascular Imaging: ***  Denice Cardon N. Stanford Breed, MD FACS [facility] Phone Number: [phone] 10/07/2022 10:01 AM   Total time spent on preparing this encounter including chart review, data review, collecting history, examining the patient, coordinating care for this {tnhtimebilling:26202}  Portions of this report may have been transcribed using voice recognition software.  Every effort has been made to ensure accuracy; however, inadvertent computerized transcription errors may still be present.

## 2022-10-08 ENCOUNTER — Encounter: Payer: Self-pay | Admitting: Vascular Surgery

## 2022-10-08 ENCOUNTER — Ambulatory Visit: Payer: Medicare PPO | Admitting: Vascular Surgery

## 2022-10-08 VITALS — BP 112/56 | HR 57 | Temp 97.6°F | Resp 20 | Ht 66.0 in | Wt 150.0 lb

## 2022-10-08 DIAGNOSIS — I714 Abdominal aortic aneurysm, without rupture, unspecified: Secondary | ICD-10-CM | POA: Diagnosis not present

## 2022-10-22 ENCOUNTER — Other Ambulatory Visit: Payer: Self-pay

## 2022-10-22 DIAGNOSIS — I714 Abdominal aortic aneurysm, without rupture, unspecified: Secondary | ICD-10-CM

## 2023-01-07 ENCOUNTER — Ambulatory Visit (HOSPITAL_COMMUNITY): Payer: Medicare PPO

## 2023-01-07 ENCOUNTER — Ambulatory Visit: Payer: Medicare PPO | Admitting: Vascular Surgery

## 2023-02-04 ENCOUNTER — Other Ambulatory Visit (HOSPITAL_COMMUNITY): Payer: Medicare PPO

## 2023-02-04 ENCOUNTER — Ambulatory Visit: Payer: Medicare PPO | Admitting: Vascular Surgery

## 2023-03-29 DEATH — deceased
